# Patient Record
Sex: Female | Born: 1970 | Hispanic: No | Marital: Married | Smoking: Never smoker
Health system: Southern US, Community
[De-identification: ages and names within clinical notes are randomized; demographics above are authoritative.]

## PROBLEM LIST (undated history)

## (undated) DIAGNOSIS — R202 Paresthesia of skin: Secondary | ICD-10-CM

## (undated) DIAGNOSIS — T7840XA Allergy, unspecified, initial encounter: Secondary | ICD-10-CM

## (undated) DIAGNOSIS — M4802 Spinal stenosis, cervical region: Secondary | ICD-10-CM

## (undated) DIAGNOSIS — M5090 Cervical disc disorder, unspecified, unspecified cervical region: Secondary | ICD-10-CM

## (undated) DIAGNOSIS — M519 Unspecified thoracic, thoracolumbar and lumbosacral intervertebral disc disorder: Secondary | ICD-10-CM

## (undated) DIAGNOSIS — D649 Anemia, unspecified: Secondary | ICD-10-CM

## (undated) DIAGNOSIS — E042 Nontoxic multinodular goiter: Secondary | ICD-10-CM

## (undated) DIAGNOSIS — E785 Hyperlipidemia, unspecified: Secondary | ICD-10-CM

## (undated) DIAGNOSIS — N390 Urinary tract infection, site not specified: Secondary | ICD-10-CM

## (undated) HISTORY — DX: Hyperlipidemia, unspecified: E78.5

## (undated) HISTORY — DX: Paresthesia of skin: R20.2

## (undated) HISTORY — DX: Allergy, unspecified, initial encounter: T78.40XA

## (undated) HISTORY — DX: Cervical disc disorder, unspecified, unspecified cervical region: M50.90

## (undated) HISTORY — PX: OTHER SURGICAL HISTORY: SHX169

## (undated) HISTORY — PX: COLPOSCOPY: SHX161

## (undated) HISTORY — DX: Urinary tract infection, site not specified: N39.0

## (undated) HISTORY — PX: WISDOM TOOTH EXTRACTION: SHX21

## (undated) HISTORY — DX: Unspecified thoracic, thoracolumbar and lumbosacral intervertebral disc disorder: M51.9

## (undated) HISTORY — DX: Spinal stenosis, cervical region: M48.02

## (undated) HISTORY — DX: Anemia, unspecified: D64.9

---

## 1998-02-28 ENCOUNTER — Emergency Department (HOSPITAL_COMMUNITY): Admission: EM | Admit: 1998-02-28 | Discharge: 1998-02-28 | Payer: Self-pay | Admitting: Emergency Medicine

## 1998-03-26 ENCOUNTER — Emergency Department (HOSPITAL_COMMUNITY): Admission: EM | Admit: 1998-03-26 | Discharge: 1998-03-26 | Payer: Self-pay | Admitting: Emergency Medicine

## 1999-04-12 ENCOUNTER — Encounter: Admission: RE | Admit: 1999-04-12 | Discharge: 1999-04-12 | Payer: Self-pay | Admitting: Internal Medicine

## 1999-04-16 ENCOUNTER — Encounter: Admission: RE | Admit: 1999-04-16 | Discharge: 1999-04-16 | Payer: Self-pay | Admitting: Internal Medicine

## 1999-11-30 ENCOUNTER — Other Ambulatory Visit: Admission: RE | Admit: 1999-11-30 | Discharge: 1999-11-30 | Payer: Self-pay | Admitting: Obstetrics & Gynecology

## 2001-01-14 ENCOUNTER — Other Ambulatory Visit: Admission: RE | Admit: 2001-01-14 | Discharge: 2001-01-14 | Payer: Self-pay | Admitting: Obstetrics and Gynecology

## 2002-02-11 ENCOUNTER — Other Ambulatory Visit: Admission: RE | Admit: 2002-02-11 | Discharge: 2002-02-11 | Payer: Self-pay | Admitting: Obstetrics and Gynecology

## 2003-02-23 ENCOUNTER — Other Ambulatory Visit: Admission: RE | Admit: 2003-02-23 | Discharge: 2003-02-23 | Payer: Self-pay | Admitting: Obstetrics and Gynecology

## 2003-11-06 ENCOUNTER — Emergency Department (HOSPITAL_COMMUNITY): Admission: EM | Admit: 2003-11-06 | Discharge: 2003-11-06 | Payer: Self-pay | Admitting: *Deleted

## 2004-03-07 ENCOUNTER — Other Ambulatory Visit: Admission: RE | Admit: 2004-03-07 | Discharge: 2004-03-07 | Payer: Self-pay | Admitting: *Deleted

## 2004-09-02 ENCOUNTER — Emergency Department (HOSPITAL_COMMUNITY): Admission: EM | Admit: 2004-09-02 | Discharge: 2004-09-02 | Payer: Self-pay | Admitting: Emergency Medicine

## 2005-03-11 ENCOUNTER — Other Ambulatory Visit: Admission: RE | Admit: 2005-03-11 | Discharge: 2005-03-11 | Payer: Self-pay | Admitting: Obstetrics and Gynecology

## 2005-06-05 ENCOUNTER — Emergency Department (HOSPITAL_COMMUNITY): Admission: EM | Admit: 2005-06-05 | Discharge: 2005-06-05 | Payer: Self-pay | Admitting: Family Medicine

## 2005-06-13 ENCOUNTER — Emergency Department (HOSPITAL_COMMUNITY): Admission: EM | Admit: 2005-06-13 | Discharge: 2005-06-13 | Payer: Self-pay | Admitting: Family Medicine

## 2005-09-26 ENCOUNTER — Encounter: Admission: RE | Admit: 2005-09-26 | Discharge: 2005-09-26 | Payer: Self-pay | Admitting: Gastroenterology

## 2006-03-14 ENCOUNTER — Other Ambulatory Visit: Admission: RE | Admit: 2006-03-14 | Discharge: 2006-03-14 | Payer: Self-pay | Admitting: Obstetrics and Gynecology

## 2007-11-11 ENCOUNTER — Encounter: Admission: RE | Admit: 2007-11-11 | Discharge: 2007-12-16 | Payer: Self-pay | Admitting: Family Medicine

## 2007-12-29 ENCOUNTER — Encounter: Admission: RE | Admit: 2007-12-29 | Discharge: 2008-03-28 | Payer: Self-pay | Admitting: Family Medicine

## 2010-11-02 ENCOUNTER — Inpatient Hospital Stay (HOSPITAL_COMMUNITY)
Admission: AD | Admit: 2010-11-02 | Discharge: 2010-11-04 | DRG: 373 | Disposition: A | Discharge status: Home / Self Care | Payer: BC Managed Care – PPO | Source: Ambulatory Visit | Attending: Obstetrics and Gynecology | Admitting: Obstetrics and Gynecology

## 2010-11-02 DIAGNOSIS — O3660X0 Maternal care for excessive fetal growth, unspecified trimester, not applicable or unspecified: Secondary | ICD-10-CM | POA: Diagnosis present

## 2010-11-02 DIAGNOSIS — O09529 Supervision of elderly multigravida, unspecified trimester: Secondary | ICD-10-CM | POA: Diagnosis present

## 2010-11-02 LAB — CBC
HCT: 38.3 % (ref 36.0–46.0)
Hemoglobin: 12.7 g/dL (ref 12.0–15.0)
MCH: 29.7 pg (ref 26.0–34.0)
MCHC: 33.2 g/dL (ref 30.0–36.0)
MCV: 89.5 fL (ref 78.0–100.0)
Platelets: 153 10*3/uL (ref 150–400)
RBC: 4.28 MIL/uL (ref 3.87–5.11)
RDW: 13.4 % (ref 11.5–15.5)
WBC: 8.5 10*3/uL (ref 4.0–10.5)

## 2010-11-02 LAB — RPR: RPR Ser Ql: NONREACTIVE

## 2010-11-03 LAB — CBC
Hemoglobin: 11 g/dL — ABNORMAL LOW (ref 12.0–15.0)
MCH: 29.1 pg (ref 26.0–34.0)
RBC: 3.78 MIL/uL — ABNORMAL LOW (ref 3.87–5.11)
WBC: 13.2 10*3/uL — ABNORMAL HIGH (ref 4.0–10.5)

## 2011-04-12 ENCOUNTER — Encounter: Payer: Self-pay | Admitting: Internal Medicine

## 2011-04-12 ENCOUNTER — Other Ambulatory Visit (INDEPENDENT_AMBULATORY_CARE_PROVIDER_SITE_OTHER): Payer: BC Managed Care – PPO

## 2011-04-12 ENCOUNTER — Ambulatory Visit (INDEPENDENT_AMBULATORY_CARE_PROVIDER_SITE_OTHER): Payer: BC Managed Care – PPO | Admitting: Internal Medicine

## 2011-04-12 ENCOUNTER — Other Ambulatory Visit: Payer: Self-pay | Admitting: Internal Medicine

## 2011-04-12 VITALS — BP 100/70 | HR 96 | Temp 99.1°F | Ht 63.0 in | Wt 186.2 lb

## 2011-04-12 DIAGNOSIS — M4802 Spinal stenosis, cervical region: Secondary | ICD-10-CM | POA: Insufficient documentation

## 2011-04-12 DIAGNOSIS — M5090 Cervical disc disorder, unspecified, unspecified cervical region: Secondary | ICD-10-CM | POA: Insufficient documentation

## 2011-04-12 DIAGNOSIS — D649 Anemia, unspecified: Secondary | ICD-10-CM | POA: Insufficient documentation

## 2011-04-12 DIAGNOSIS — Z23 Encounter for immunization: Secondary | ICD-10-CM

## 2011-04-12 DIAGNOSIS — E785 Hyperlipidemia, unspecified: Secondary | ICD-10-CM | POA: Insufficient documentation

## 2011-04-12 DIAGNOSIS — T7840XA Allergy, unspecified, initial encounter: Secondary | ICD-10-CM | POA: Insufficient documentation

## 2011-04-12 DIAGNOSIS — Z0000 Encounter for general adult medical examination without abnormal findings: Secondary | ICD-10-CM

## 2011-04-12 DIAGNOSIS — K5190 Ulcerative colitis, unspecified, without complications: Secondary | ICD-10-CM | POA: Insufficient documentation

## 2011-04-12 DIAGNOSIS — R202 Paresthesia of skin: Secondary | ICD-10-CM | POA: Insufficient documentation

## 2011-04-12 DIAGNOSIS — M519 Unspecified thoracic, thoracolumbar and lumbosacral intervertebral disc disorder: Secondary | ICD-10-CM | POA: Insufficient documentation

## 2011-04-12 LAB — CBC WITH DIFFERENTIAL/PLATELET
Eosinophils Absolute: 0.1 10*3/uL (ref 0.0–0.7)
Hemoglobin: 13.4 g/dL (ref 12.0–15.0)
Lymphs Abs: 2.2 10*3/uL (ref 0.7–4.0)
Monocytes Absolute: 0.4 10*3/uL (ref 0.1–1.0)
RBC: 4.7 Mil/uL (ref 3.87–5.11)

## 2011-04-12 LAB — HEPATIC FUNCTION PANEL
Albumin: 3.9 g/dL (ref 3.5–5.2)
Alkaline Phosphatase: 95 U/L (ref 39–117)
Bilirubin, Direct: 0.2 mg/dL (ref 0.0–0.3)
Total Bilirubin: 1 mg/dL (ref 0.3–1.2)
Total Protein: 7.4 g/dL (ref 6.0–8.3)

## 2011-04-12 LAB — TSH: TSH: 0.41 u[IU]/mL (ref 0.35–5.50)

## 2011-04-12 LAB — LIPID PANEL
HDL: 47.5 mg/dL (ref >39.00)
Total CHOL/HDL Ratio: 4
Triglycerides: 61 mg/dL (ref 0.0–149.0)
VLDL: 12.2 mg/dL (ref 0.0–40.0)

## 2011-04-12 LAB — BASIC METABOLIC PANEL
Calcium: 8.6 mg/dL (ref 8.4–10.5)
Chloride: 104 mEq/L (ref 96–112)
Creatinine, Ser: 0.7 mg/dL (ref 0.4–1.2)

## 2011-04-12 LAB — LDL CHOLESTEROL, DIRECT: Direct LDL: 149.6 mg/dL

## 2011-04-12 MED ORDER — TETANUS-DIPHTH-ACELL PERTUSSIS 5-2.5-18.5 LF-MCG/0.5 IM SUSP
0.5000 mL | Freq: Once | INTRAMUSCULAR | Status: AC
  Administered 2011-04-12: 0.5 mL via INTRAMUSCULAR

## 2011-04-12 NOTE — Progress Notes (Signed)
Subjective:    Patient ID: Colleen Mcgee, female    DOB: 1970/09/18, 40 y.o.   MRN: [id number]  HPI Here for wellness and f/u;  Overall doing ok;  Pt denies CP, worsening SOB, DOE, wheezing, orthopnea, PND, worsening LE edema, palpitations, dizziness or syncope.  Pt denies neurological change such as new Headache, facial or extremity weakness.  Pt denies polydipsia, polyuria, or low sugar symptoms. Pt states overall good compliance with treatment and medications, good tolerability, and trying to follow lower cholesterol diet.  Pt denies worsening depressive symptoms, suicidal ideation or panic. No fever, wt loss, night sweats, loss of appetite, or other constitutional symptoms.  Pt states good ability with ADL's, low fall risk, home safety reviewed and adequate, no significant changes in hearing or vision, and occasionally active with exercise, but plans to do more in an effort to lose the baby wt (postpartum approx 5 mo)  Has recurring lower back aching which limits her now, but no radicaular pain and thinks it may get better with wt loss as she has done in the past Past Medical History  Diagnosis Date  . UTI (lower urinary tract infection)   . Cervical stenosis of spine   . Allergy   . Asthma   . Hyperlipidemia   . Anemia   . Ulcerative colitis   . Cervical disc disease   . Lumbar disc disease   . Distal paresthesia    Past Surgical History  Procedure Date  . Colposcopy     abnormal paps- 1992; 2010 and 2012    reports that she has never smoked. She does not have any smokeless tobacco history on file. She reports that she drinks alcohol. She reports that she does not use illicit drugs. family history includes Alcohol abuse in her father; Cancer in her mother, other, and paternal grandmother; Diabetes in her maternal grandmother; Hyperlipidemia in her maternal grandmother and mother; Hypertension in her father, maternal grandfather, maternal grandmother, and mother; Seizures in her father; and  Stroke in her maternal grandmother and paternal grandfather. No Known Allergies No current outpatient prescriptions on file prior to visit.   No current facility-administered medications on file prior to visit.   Review of Systems Review of Systems  Constitutional: Negative for diaphoresis, activity change, appetite change and unexpected weight change.  HENT: Negative for hearing loss, ear pain, facial swelling, mouth sores and neck stiffness.   Eyes: Negative for pain, redness and visual disturbance.  Respiratory: Negative for shortness of breath and wheezing.   Cardiovascular: Negative for chest pain and palpitations.  Gastrointestinal: Negative for diarrhea, blood in stool, abdominal distention and rectal pain.  Genitourinary: Negative for hematuria, flank pain and decreased urine volume.  Musculoskeletal: Negative for myalgias and joint swelling.  Skin: Negative for color change and wound.  Neurological: Negative for syncope and numbness.  Hematological: Negative for adenopathy.  Psychiatric/Behavioral: Negative for hallucinations, self-injury, decreased concentration and agitation.      Objective:   Physical Exam BP 100/70  Pulse 96  Temp(Src) 99.1 F (37.3 C) (Oral)  Ht 5\' 3"  (1.6 m)  Wt 186 lb 4 oz (84.482 kg)  BMI 32.99 kg/m2  SpO2 98%  LMP 04/10/2011 Physical Exam  VS noted, obese Constitutional: Pt is oriented to person, place, and time. Appears well-developed. Head: Normocephalic and atraumatic.  Right Ear: External ear normal.  Left Ear: External ear normal.  Nose: Nose normal.  Mouth/Throat: Oropharynx is clear and moist.  Eyes: Conjunctivae and EOM are normal. Pupils are equal,  round, and reactive to light.  Neck: Normal range of motion. Neck supple. No JVD present. No tracheal deviation present.  Cardiovascular: Normal rate, regular rhythm, normal heart sounds and intact distal pulses.   Pulmonary/Chest: Effort normal and breath sounds normal.  Abdominal:  Soft. Bowel sounds are normal. There is no tenderness.  Musculoskeletal: Normal range of motion. Exhibits no edema.  Lymphadenopathy:  Has no cervical adenopathy.  Neurological: Pt is alert and oriented to person, place, and time. Pt has normal reflexes. No cranial nerve deficit.  Skin: Skin is warm and dry. No rash noted.  Psychiatric:  Has  normal mood and affect. Behavior is normal.  No spine tender today       Assessment & Plan:   No problem-specific assessment & plan notes found for this encounter.

## 2011-04-12 NOTE — Patient Instructions (Addendum)
You had the tetanus shot today Please keep your appointments with your specialists as you have planned - GYN for pap and mammogram next month Please go to LAB in the Basement for the blood and/or urine tests to be done today Please call the phone number [phone] (the PhoneTree System) for results of testing in 2-3 days;  When calling, simply dial the number, and when prompted enter the MRN number above (the Medical Record Number) and the # key, then the message should start. Please return in 1 year for your yearly visit, or sooner if needed, with Lab testing done 3-5 days before

## 2011-04-12 NOTE — Assessment & Plan Note (Signed)
Overall doing well, age appropriate education and counseling updated, referrals for preventative services and immunizations addressed, dietary and smoking counseling addressed, most recent labs and ECG reviewed.  I have personally reviewed and have noted: 1) the patient's medical and social history 2) The pt's use of alcohol, tobacco, and illicit drugs 3) The patient's current medications and supplements 4) Functional ability including ADL's, fall risk, home safety risk, hearing and visual impairment 5) Diet and physical activities 6) Evidence for depression or mood disorder 7) The patient's height, weight, and BMI have been recorded in the chart I have made referrals, and provided counseling and education based on review of the above Due for tetanus today

## 2011-06-21 ENCOUNTER — Telehealth: Payer: Self-pay | Admitting: *Deleted

## 2011-06-21 MED ORDER — PHENTERMINE HCL 37.5 MG PO CAPS: 37.5000 mg | ORAL_CAPSULE | ORAL | Status: AC

## 2011-06-21 NOTE — Telephone Encounter (Signed)
Left msg on vm had discuss phentermine with md at last ov. Pt is wanting to go ahead and try phentermine. Is this ok?

## 2011-06-21 NOTE — Telephone Encounter (Signed)
Done to robin desk 

## 2011-06-24 NOTE — Telephone Encounter (Signed)
Called the patient left message that Phentermine was filled and faxed hardcopy to [facility] at [phone]

## 2011-07-26 ENCOUNTER — Telehealth: Payer: Self-pay

## 2011-07-26 NOTE — Telephone Encounter (Signed)
Called [facility] to Prior Authorize Phentermine, case number [id number]. Medication was approved from Oct. 10, 2012 through October 24, 2011. Called patient left message informed of approval.

## 2011-11-12 ENCOUNTER — Other Ambulatory Visit: Payer: Self-pay | Admitting: Obstetrics and Gynecology

## 2011-11-12 ENCOUNTER — Ambulatory Visit (INDEPENDENT_AMBULATORY_CARE_PROVIDER_SITE_OTHER): Payer: BC Managed Care – PPO | Admitting: Registered Nurse

## 2011-11-12 DIAGNOSIS — Z01419 Encounter for gynecological examination (general) (routine) without abnormal findings: Secondary | ICD-10-CM

## 2011-11-12 DIAGNOSIS — Z1231 Encounter for screening mammogram for malignant neoplasm of breast: Secondary | ICD-10-CM

## 2011-11-12 DIAGNOSIS — Z202 Contact with and (suspected) exposure to infections with a predominantly sexual mode of transmission: Secondary | ICD-10-CM

## 2011-12-02 ENCOUNTER — Ambulatory Visit: Payer: BC Managed Care – PPO

## 2011-12-18 ENCOUNTER — Ambulatory Visit: Payer: BC Managed Care – PPO

## 2012-06-30 ENCOUNTER — Telehealth: Payer: Self-pay

## 2012-06-30 ENCOUNTER — Ambulatory Visit (INDEPENDENT_AMBULATORY_CARE_PROVIDER_SITE_OTHER): Payer: BC Managed Care – PPO | Admitting: Emergency Medicine

## 2012-06-30 VITALS — BP 130/82 | HR 81 | Temp 99.3°F | Resp 19 | Ht 63.75 in | Wt 188.2 lb

## 2012-06-30 DIAGNOSIS — J018 Other acute sinusitis: Secondary | ICD-10-CM

## 2012-06-30 MED ORDER — AMOXICILLIN-POT CLAVULANATE 875-125 MG PO TABS: 1.0000 | ORAL_TABLET | Freq: Two times a day (BID) | ORAL | Status: DC

## 2012-06-30 MED ORDER — PSEUDOEPHEDRINE-GUAIFENESIN ER 60-600 MG PO TB12: 1.0000 | ORAL_TABLET | Freq: Two times a day (BID) | ORAL | Status: AC

## 2012-06-30 NOTE — Telephone Encounter (Signed)
PATIENT STATES SHE SAW DR. Dareen Piano Tuesday FOR A SINUS INFECTION. SHE WOULD LIKE TO GET A NOTE FOR WORK. 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 SHE WOULD LIKE THE NOTE TO HAVE HER OUT ON MON 10/14, TUES. 10/15 AND WED. 07/01/2012. SHE WOULD LIKE TO RETURN ON THURS. 07/02/2012. PLEASE CALL HER WHEN THE NOTE CAN BE PICKED UP. BEST PHONE [phone]. (CELL)   MBC

## 2012-06-30 NOTE — Telephone Encounter (Signed)
Note up front, LMOM to pick up note.

## 2012-06-30 NOTE — Progress Notes (Signed)
[facility] [address] [place] [address] [phone]  Date:  06/30/2012   Name:  Colleen Mcgee   DOB:  [birth year]   MRN:  [id number]  PCP:  Oliver Barre, MD    Chief Complaint: Sore Throat, Generalized Body Aches and Sinusitis   History of Present Illness:  Colleen Mcgee is a 41 y.o. very pleasant female patient who presents with the following:  Marked nasal congestion and post nasal drainage for past 5 days associated with sore throat and headache. Now has myalgias and malaise.  Had chills yesterday and a low grade temperature today.  Cough is not productive and worse at night.  Not improved with OTC medication.  History of prior sinusitis.  Patient Active Problem List  Diagnosis  . Allergy  . Asthma  . Hyperlipidemia  . Anemia  . Cervical stenosis of spine  . Ulcerative colitis  . Cervical disc disease  . Lumbar disc disease  . Distal paresthesia  . Preventative health care    Past Medical History  Diagnosis Date  . UTI (lower urinary tract infection)   . Cervical stenosis of spine   . Allergy   . Asthma   . Hyperlipidemia   . Anemia   . Ulcerative colitis   . Cervical disc disease   . Lumbar disc disease   . Distal paresthesia     Past Surgical History  Procedure Date  . Colposcopy     abnormal paps- 1992; 2010 and 2012    History  Substance Use Topics  . Smoking status: Never Smoker   . Smokeless tobacco: Never Used  . Alcohol Use: No     social    Family History  Problem Relation Age of Onset  . Cancer Mother     colon cancer  . Hyperlipidemia Mother   . Hypertension Mother   . Alcohol abuse Father   . Hypertension Father   . Seizures Father   . Hyperlipidemia Maternal Grandmother   . Stroke Maternal Grandmother   . Hypertension Maternal Grandmother   . Diabetes Maternal Grandmother   . Hypertension Maternal Grandfather   . Cancer Paternal Grandmother     breast cancer  . Stroke Paternal Grandfather   . Cancer  Other     prostate cancer    No Known Allergies  Medication list has been reviewed and updated.  Current Outpatient Prescriptions on File Prior to Visit  Medication Sig Dispense Refill  . levonorgestrel-ethinyl estradiol (NORDETTE) 0.15-30 MG-MCG tablet Take 1 tablet by mouth daily.          Review of Systems:  As per HPI, otherwise negative.    Physical Examination: Filed Vitals:   06/30/12 1426  BP: 130/82  Pulse: 81  Temp: 99.3 F (37.4 C)  Resp: 19   Filed Vitals:   06/30/12 1426  Height: 5' 3.75" (1.619 m)  Weight: 188 lb 3.2 oz (85.367 kg)   Body mass index is 32.56 kg/(m^2). Ideal Body Weight: Weight in (lb) to have BMI = 25: 144.2   GEN: WDWN, NAD, Non-toxic, A & O x 3.  No rash or sepsis HEENT: Atraumatic, Normocephalic. Neck supple. No masses, No LAD.  Oropharynx negative Ears and Nose: No external deformity.  TM negative CV: RRR, No M/G/R. No JVD. No thrill. No extra heart sounds. PULM: CTA B, no wheezes, crackles, rhonchi. No retractions. No resp. distress. No accessory muscle use. ABD: S, NT, ND, +BS. No rebound. No HSM. EXTR: No c/c/e  NEURO Normal gait.  PSYCH: Normally interactive. Conversant. Not depressed or anxious appearing.  Calm demeanor.    Assessment and Plan: Sinusitis augmentin mucinex   Carmelina Dane, MD  I have reviewed and agree with documentation. Robert P. Merla Riches, M.D.

## 2013-05-21 ENCOUNTER — Other Ambulatory Visit: Payer: Self-pay | Admitting: Obstetrics and Gynecology

## 2013-05-21 DIAGNOSIS — Z1231 Encounter for screening mammogram for malignant neoplasm of breast: Secondary | ICD-10-CM

## 2013-06-09 ENCOUNTER — Ambulatory Visit
Admission: RE | Admit: 2013-06-09 | Discharge: 2013-06-09 | Disposition: A | Discharge status: Home / Self Care | Payer: BC Managed Care – PPO | Source: Ambulatory Visit | Attending: Obstetrics and Gynecology | Admitting: Obstetrics and Gynecology

## 2013-06-09 DIAGNOSIS — Z1231 Encounter for screening mammogram for malignant neoplasm of breast: Secondary | ICD-10-CM

## 2014-05-03 ENCOUNTER — Other Ambulatory Visit: Payer: Self-pay

## 2014-05-03 DIAGNOSIS — Z1231 Encounter for screening mammogram for malignant neoplasm of breast: Secondary | ICD-10-CM

## 2014-06-13 ENCOUNTER — Ambulatory Visit
Admission: RE | Admit: 2014-06-13 | Discharge: 2014-06-13 | Disposition: A | Discharge status: Home / Self Care | Payer: BC Managed Care – PPO | Source: Ambulatory Visit

## 2014-06-13 DIAGNOSIS — Z1231 Encounter for screening mammogram for malignant neoplasm of breast: Secondary | ICD-10-CM

## 2015-04-17 ENCOUNTER — Other Ambulatory Visit: Payer: Self-pay | Admitting: Obstetrics and Gynecology

## 2015-04-17 DIAGNOSIS — E010 Iodine-deficiency related diffuse (endemic) goiter: Secondary | ICD-10-CM

## 2015-04-19 ENCOUNTER — Ambulatory Visit
Admission: RE | Admit: 2015-04-19 | Discharge: 2015-04-19 | Disposition: A | Discharge status: Home / Self Care | Payer: BC Managed Care – PPO | Source: Ambulatory Visit | Attending: Obstetrics and Gynecology | Admitting: Obstetrics and Gynecology

## 2015-04-19 DIAGNOSIS — E010 Iodine-deficiency related diffuse (endemic) goiter: Secondary | ICD-10-CM

## 2016-04-18 ENCOUNTER — Other Ambulatory Visit: Payer: Self-pay | Admitting: Obstetrics and Gynecology

## 2016-04-18 DIAGNOSIS — E042 Nontoxic multinodular goiter: Secondary | ICD-10-CM

## 2016-04-23 ENCOUNTER — Other Ambulatory Visit: Payer: BC Managed Care – PPO

## 2016-08-31 ENCOUNTER — Ambulatory Visit (HOSPITAL_COMMUNITY)
Admission: EM | Admit: 2016-08-31 | Discharge: 2016-08-31 | Disposition: A | Discharge status: Home / Self Care | Payer: BC Managed Care – PPO | Attending: Family Medicine | Admitting: Family Medicine

## 2016-08-31 ENCOUNTER — Encounter (HOSPITAL_COMMUNITY): Payer: Self-pay | Admitting: *Deleted

## 2016-08-31 DIAGNOSIS — J209 Acute bronchitis, unspecified: Secondary | ICD-10-CM

## 2016-08-31 DIAGNOSIS — R062 Wheezing: Secondary | ICD-10-CM

## 2016-08-31 DIAGNOSIS — R05 Cough: Secondary | ICD-10-CM

## 2016-08-31 DIAGNOSIS — R059 Cough, unspecified: Secondary | ICD-10-CM

## 2016-08-31 HISTORY — DX: Nontoxic multinodular goiter: E04.2

## 2016-08-31 MED ORDER — AZITHROMYCIN 250 MG PO TABS: 250.0000 mg | ORAL_TABLET | Freq: Every day | ORAL | 0 refills | Status: DC

## 2016-08-31 MED ORDER — METHYLPREDNISOLONE 4 MG PO TBPK: ORAL_TABLET | ORAL | 0 refills | Status: DC

## 2016-08-31 MED ORDER — BENZONATATE 100 MG PO CAPS: 200.0000 mg | ORAL_CAPSULE | Freq: Three times a day (TID) | ORAL | 0 refills | Status: DC | PRN

## 2016-08-31 MED ORDER — ALBUTEROL SULFATE HFA 108 (90 BASE) MCG/ACT IN AERS: 2.0000 | INHALATION_SPRAY | RESPIRATORY_TRACT | 0 refills | Status: DC | PRN

## 2016-08-31 NOTE — ED Provider Notes (Signed)
CSN: [id number]     Arrival date & time 08/31/16  1453 History   First MD Initiated Contact with Patient 08/31/16 1549     Chief Complaint  Patient presents with  . Nasal Congestion  . Cough   (Consider location/radiation/quality/duration/timing/severity/associated sxs/prior Treatment) C/o uri sx's and cough for 2 weeks.     The history is provided by the patient.  Cough  Cough characteristics:  Productive Sputum characteristics:  Yellow Severity:  Moderate Onset quality:  Sudden Duration:  2 weeks Timing:  Constant Progression:  Waxing and waning Chronicity:  New Smoker: no   Context: upper respiratory infection and weather changes   Relieved by:  Nothing Worsened by:  Nothing Ineffective treatments:  None tried Associated symptoms: rhinorrhea and sore throat     Past Medical History:  Diagnosis Date  . Allergy   . Anemia   . Asthma   . Cervical disc disease   . Cervical stenosis of spine   . Distal paresthesia   . Hyperlipidemia   . Lumbar disc disease   . Multiple thyroid nodules   . Ulcerative colitis   . UTI (lower urinary tract infection)    Past Surgical History:  Procedure Laterality Date  . COLPOSCOPY     abnormal paps- 1992; 2010 and 2012  . WISDOM TOOTH EXTRACTION     Family History  Problem Relation Age of Onset  . Cancer Mother     colon cancer  . Hyperlipidemia Mother   . Hypertension Mother   . Alcohol abuse Father   . Hypertension Father   . Seizures Father   . Hyperlipidemia Maternal Grandmother   . Stroke Maternal Grandmother   . Hypertension Maternal Grandmother   . Diabetes Maternal Grandmother   . Hypertension Maternal Grandfather   . Cancer Paternal Grandmother     breast cancer  . Stroke Paternal Grandfather   . Cancer Other     prostate cancer   Social History  Substance Use Topics  . Smoking status: Never Smoker  . Smokeless tobacco: Never Used  . Alcohol use No     Comment: social   OB History    No data  available     Review of Systems  Constitutional: Positive for fatigue.  HENT: Positive for rhinorrhea and sore throat.   Eyes: Negative.   Respiratory: Positive for cough.   Cardiovascular: Negative.   Gastrointestinal: Negative.   Endocrine: Negative.   Genitourinary: Negative.   Musculoskeletal: Negative.   Skin: Negative.   Allergic/Immunologic: Negative.   Neurological: Negative.   Hematological: Negative.   Psychiatric/Behavioral: Negative.     Allergies  Aspirin  Home Medications   Prior to Admission medications   Medication Sig Start Date End Date Taking? Authorizing Provider  UNKNOWN TO PATIENT BCPs   Yes Historical Provider, MD  albuterol (PROVENTIL HFA;VENTOLIN HFA) 108 (90 Base) MCG/ACT inhaler Inhale 2 puffs into the lungs every 4 (four) hours as needed for wheezing or shortness of breath. 08/31/16   Deatra CanterWilliam J , FNP  amoxicillin-clavulanate (AUGMENTIN) 875-125 MG per tablet Take 1 tablet by mouth 2 (two) times daily. 06/30/12   Carmelina DaneJeffery S Anderson, MD  azithromycin (ZITHROMAX) 250 MG tablet Take 1 tablet (250 mg total) by mouth daily. Take first 2 tablets together, then 1 every day until finished. 08/31/16   Deatra CanterWilliam J , FNP  benzonatate (TESSALON) 100 MG capsule Take 2 capsules (200 mg total) by mouth 3 (three) times daily as needed for cough. 08/31/16   Anselm PancoastWilliam J  , FNP  levonorgestrel-ethinyl estradiol (NORDETTE) 0.15-30 MG-MCG tablet Take 1 tablet by mouth daily.      Historical Provider, MD  methylPREDNISolone (MEDROL DOSEPAK) 4 MG TBPK tablet Take 6-5-4-3-2-1 po qd 08/31/16   Deatra CanterWilliam J , FNP   Meds Ordered and Administered this Visit  Medications - No data to display  BP 131/73   Pulse 99   Temp 98.5 F (36.9 C) (Oral)   Resp 18   LMP 08/19/2016 (Exact Date)   SpO2 100%  No data found.   Physical Exam  Constitutional: She appears well-developed and well-nourished.  HENT:  Head: Normocephalic and atraumatic.  Right Ear:  External ear normal.  Left Ear: External ear normal.  Mouth/Throat: Oropharynx is clear and moist.  Eyes: Conjunctivae and EOM are normal. Pupils are equal, round, and reactive to light.  Neck: Neck supple.  Cardiovascular: Normal rate, regular rhythm and normal heart sounds.   Pulmonary/Chest: Effort normal and breath sounds normal.  Abdominal: Soft. Bowel sounds are normal.  Nursing note and vitals reviewed.   Urgent Care Course   Clinical Course     Procedures (including critical care time)  Labs Review Labs Reviewed - No data to display  Imaging Review No results found.   Visual Acuity Review  Right Eye Distance:   Left Eye Distance:   Bilateral Distance:    Right Eye Near:   Left Eye Near:    Bilateral Near:         MDM   1. Acute bronchitis, unspecified organism   2. Cough   3. Wheezing    Medrol dose pack Zpak Albuterol mdi Tessalon perles Push po fluids, rest, tylenol and motrin otc prn as directed for fever, arthralgias, and myalgias.  Follow up prn if sx's continue or persist.    Deatra CanterWilliam J , FNP 08/31/16 [phone]

## 2016-08-31 NOTE — ED Triage Notes (Signed)
Reports starting with sinus/facial pain and congestion approx 2 wks ago.  Congestion now moving down into chest.  C/O slightly bloody nasal discharge, dry wheezy cough.  Has had slightly elevated temp 3 days ago.  Has tried IBU, [place].

## 2019-11-17 ENCOUNTER — Other Ambulatory Visit (HOSPITAL_COMMUNITY): Payer: Self-pay | Admitting: Surgery

## 2019-11-17 ENCOUNTER — Other Ambulatory Visit: Payer: Self-pay | Admitting: Surgery

## 2019-11-25 ENCOUNTER — Other Ambulatory Visit: Payer: Self-pay

## 2019-11-25 ENCOUNTER — Ambulatory Visit (HOSPITAL_COMMUNITY)
Admission: RE | Admit: 2019-11-25 | Discharge: 2019-11-25 | Disposition: A | Discharge status: Home / Self Care | Payer: BC Managed Care – PPO | Source: Ambulatory Visit | Attending: Surgery | Admitting: Surgery

## 2019-11-30 ENCOUNTER — Other Ambulatory Visit: Payer: Self-pay | Admitting: Obstetrics and Gynecology

## 2019-11-30 DIAGNOSIS — N920 Excessive and frequent menstruation with regular cycle: Secondary | ICD-10-CM

## 2019-12-01 ENCOUNTER — Ambulatory Visit
Admission: RE | Admit: 2019-12-01 | Discharge: 2019-12-01 | Disposition: A | Discharge status: Home / Self Care | Payer: BC Managed Care – PPO | Source: Ambulatory Visit | Attending: Obstetrics and Gynecology | Admitting: Obstetrics and Gynecology

## 2019-12-01 DIAGNOSIS — N920 Excessive and frequent menstruation with regular cycle: Secondary | ICD-10-CM

## 2019-12-03 ENCOUNTER — Other Ambulatory Visit: Payer: Self-pay | Admitting: Obstetrics and Gynecology

## 2019-12-10 ENCOUNTER — Encounter: Payer: Self-pay | Admitting: Dietician

## 2019-12-10 ENCOUNTER — Other Ambulatory Visit: Payer: Self-pay

## 2019-12-10 ENCOUNTER — Encounter: Payer: BC Managed Care – PPO | Attending: Surgery | Admitting: Dietician

## 2019-12-10 DIAGNOSIS — E669 Obesity, unspecified: Secondary | ICD-10-CM | POA: Insufficient documentation

## 2019-12-10 NOTE — Progress Notes (Signed)
Nutrition Assessment for Bariatric Surgery Medical Nutrition Therapy   Patient was seen on 12/10/2019 for Pre-Operative Nutrition Assessment. Letter of approval faxed to [facility] bariatric surgery program coordinator on 12/10/2019.    Referral stated Supervised Weight Loss (SWL) visits needed: 0  Planned surgery: Sleeve Pt expectation of surgery: to feel better physically, be able to run more, feel more confident   NUTRITION ASSESSMENT   Anthropometrics  Start weight at [facility]: 230 lbs (date: 12/10/2019) Height: 63 in BMI: 40.7 kg/m2     Lifestyle & Dietary Hx Works a lot, works a second job in the [place]. Cares for family.   Does not eat breakfast usually, typical meal pattern is 2 meals plus snacks in the evening. Likes soups for lunch and will get vegetables in these. May make stir fry with chicken, chili, or a pasta dish. Eats out a few times per week, may have [place] or Mediterranean, [employer]. Avoids beef and pork. Snacks include chips, cheese puffs, oatmeal cream pie cookies.   24-Hr Dietary Recall First Meal: coffee + collagen powder Snack: - Second Meal: [employer] tomato basil soup + 1/2 sandwich  Snack: - Third Meal: tacos  Snack: chips Beverages: water, zero calorie sparkling water   NUTRITION DIAGNOSIS  Overweight/obesity ([place]-3.3) related to past poor dietary habits and physical inactivity as evidenced by patient w/ planned Sleeve Gastrectomy surgery following dietary guidelines for continued weight loss.    NUTRITION INTERVENTION  Nutrition counseling (C-1) and education (E-2) to facilitate bariatric surgery goals.  Pre-Op Goals Reviewed with the Patient . Track food and beverage intake (pen and paper, MyFitness Pal, Baritastic app, etc.) . Make healthy food choices while monitoring portion sizes . Consume 3 meals per day or try to eat every 3-5 hours . Avoid concentrated sugars and fried foods . Keep sugar & fat in the single digits per serving on food  labels . Practice CHEWING your food (aim for applesauce consistency) . Practice not drinking 15 minutes before, during, and 30 minutes after each meal and snack . Avoid all carbonated beverages (ex: soda, sparkling beverages)  . Limit caffeinated beverages (ex: coffee, tea, energy drinks) . Avoid all sugar-sweetened beverages (ex: regular soda, sports drinks)  . Avoid alcohol  . Aim for 64-100 ounces of FLUID daily (with at least half of fluid intake being plain water)  . Aim for at least 60-80 grams of PROTEIN daily . Look for a liquid protein source that contains ?15 g protein and ?5 g carbohydrate (ex: shakes, drinks, shots) . Make a list of non-food related activities . Physical activity is an important part of a healthy lifestyle so keep it moving! The goal is to reach 150 minutes of exercise per week, including cardiovascular and weight baring activity.  *Goals that are bolded indicate the pt would like to start working towards these  Handouts Provided Include  . Bariatric Surgery handouts (Nutrition Visits, Pre-Op Goals, Protein Shakes, Vitamins & Minerals)  Learning Style & Readiness for Change Teaching method utilized: Visual & Auditory  Demonstrated degree of understanding via: Teach Back  Barriers to learning/adherence to lifestyle change: None Identified   MONITORING & EVALUATION Dietary intake, weekly physical activity, body weight, and pre-op goals reached at next nutrition visit.   Next Steps Patient is to follow up at [facility] for Pre-Op Class (>2 weeks before surgery) for further nutrition education.

## 2020-12-25 ENCOUNTER — Telehealth: Payer: Self-pay | Admitting: Hematology and Oncology

## 2020-12-25 NOTE — Telephone Encounter (Signed)
Received a new hem referral for IDA. Colleen Mcgee returned my call and has been scheduled to see Dr. Bertis Ruddy on 4/15 at 2pm. Pt aware to arrive 30 minutes early.

## 2020-12-26 ENCOUNTER — Telehealth: Payer: Self-pay

## 2020-12-26 NOTE — Telephone Encounter (Signed)
Called regarding appt on Friday, ask her to arrive at 1 pm for 1:30 pm appt. She is agreeable and aware of appt times.

## 2020-12-29 ENCOUNTER — Other Ambulatory Visit: Payer: Self-pay

## 2020-12-29 ENCOUNTER — Encounter: Payer: Self-pay | Admitting: Hematology and Oncology

## 2020-12-29 ENCOUNTER — Inpatient Hospital Stay: Payer: BC Managed Care – PPO | Attending: Hematology and Oncology | Admitting: Hematology and Oncology

## 2020-12-29 ENCOUNTER — Telehealth: Payer: Self-pay | Admitting: Hematology and Oncology

## 2020-12-29 DIAGNOSIS — D649 Anemia, unspecified: Secondary | ICD-10-CM | POA: Insufficient documentation

## 2020-12-29 DIAGNOSIS — R233 Spontaneous ecchymoses: Secondary | ICD-10-CM | POA: Insufficient documentation

## 2020-12-29 DIAGNOSIS — Z6841 Body Mass Index (BMI) 40.0 and over, adult: Secondary | ICD-10-CM

## 2020-12-29 DIAGNOSIS — R238 Other skin changes: Secondary | ICD-10-CM

## 2020-12-29 NOTE — Telephone Encounter (Signed)
Scheduled per los. Declined printout  

## 2020-12-29 NOTE — Progress Notes (Signed)
[facility] CONSULT NOTE  Patient Care Team: Mila Palmer, MD as PCP - General (Family Medicine)  CHIEF COMPLAINTS/PURPOSE OF CONSULTATION:  Anemia, easy bruising  HISTORY OF PRESENTING ILLNESS:  Colleen Mcgee 50 y.o. female is here because of referral from [facility] bariatric surgery team for evaluation of this patient with history of anemia and easy bruising The patient have class III obesity and is currently being evaluated for possible bariatric surgery in the future I do not have a recent copy of her blood work Anemia is in her history but according to latest set of blood work that I could see dated 05/04/2020 from [place], she is not anemic and not iron deficient The patient stated she have history of iron deficiency secondary to ulcerative colitis but for the past few years, her anemia has resolved She denies significant flare of ulcerative colitis She has not needed steroid treatment for a long time She also have history of sinus congestion and asthma but has not been on steroid containing products for a long time  She believes the main purpose of seeing me is to rule out bleeding disorder The patient stated that she bruises easily A few times over the past few months, she noted spontaneous bruising and did not recall hitting herself or injuring herself She denies spontaneous bleeding episodes She has not needed blood transfusion in the past She takes occasional NSAID but denies taking any aspirin containing products She had wisdom tooth removal, cataract surgery and colposcopy in the past without excessive bleeding She has natural childbirth and denies postpartum hemorrhage She is currently on birth control pill to control her menstrual cycles She desires bariatric surgery this summer and hence is referred here for surgical clearance from hematology standpoint  MEDICAL HISTORY:  Past Medical History:  Diagnosis Date  . Allergy   . Anemia   . Asthma   . Cervical disc  disease   . Cervical stenosis of spine   . Distal paresthesia   . Hyperlipidemia   . Lumbar disc disease   . Multiple thyroid nodules   . Ulcerative colitis   . UTI (lower urinary tract infection)     SURGICAL HISTORY: Past Surgical History:  Procedure Laterality Date  . cataract Left   . COLPOSCOPY     abnormal paps- 1992; 2010 and 2012  . WISDOM TOOTH EXTRACTION      SOCIAL HISTORY: Social History   Socioeconomic History  . Marital status: Single    Spouse name: Not on file  . Number of children: 2  . Years of education: 42  . Highest education level: Not on file  Occupational History  . Occupation: ED. S teacher  Tobacco Use  . Smoking status: Never Smoker  . Smokeless tobacco: Never Used  Substance and Sexual Activity  . Alcohol use: No    Comment: social  . Drug use: No  . Sexual activity: Not on file  Other Topics Concern  . Not on file  Social History Narrative  . Not on file   Social Determinants of Health   Financial Resource Strain: Not on file  Food Insecurity: Not on file  Transportation Needs: Not on file  Physical Activity: Not on file  Stress: Not on file  Social Connections: Not on file  Intimate Partner Violence: Not on file    FAMILY HISTORY: Family History  Problem Relation Age of Onset  . Cancer Mother        colon cancer  . Hyperlipidemia Mother   .  Hypertension Mother   . Alcohol abuse Father   . Hypertension Father   . Seizures Father   . Hyperlipidemia Maternal Grandmother   . Stroke Maternal Grandmother   . Hypertension Maternal Grandmother   . Diabetes Maternal Grandmother   . Hypertension Maternal Grandfather   . Cancer Paternal Grandmother        breast cancer  . Stroke Paternal Grandfather   . Cancer Other        prostate cancer    ALLERGIES:  is allergic to aspirin.  MEDICATIONS:  Current Outpatient Medications  Medication Sig Dispense Refill  . CHLOROPHYLL PO Take 1 drop by mouth daily.    .  cholecalciferol (VITAMIN D3) 25 MCG (1000 UNIT) tablet Take 1,000 Units by mouth daily.    . COLLAGEN PO Take 1 Scoop by mouth daily.    . Multiple Vitamins-Iron (MULTIVITAMINS WITH IRON) TABS tablet Take 1 tablet by mouth daily.    . norethindrone-ethinyl estradiol (LOESTRIN FE) 1-20 MG-MCG tablet Take 1 tablet by mouth daily.    . vitamin B-12 (CYANOCOBALAMIN) 1000 MCG tablet Take 1,000 mcg by mouth daily.    Marland Kitchen albuterol (PROVENTIL HFA;VENTOLIN HFA) 108 (90 Base) MCG/ACT inhaler Inhale 2 puffs into the lungs every 4 (four) hours as needed for wheezing or shortness of breath. 1 Inhaler 0   No current facility-administered medications for this visit.    REVIEW OF SYSTEMS:   Constitutional: Denies fevers, chills or abnormal night sweats Eyes: Denies blurriness of vision, double vision or watery eyes Ears, nose, mouth, throat, and face: Denies mucositis or sore throat Respiratory: Denies cough, dyspnea or wheezes Cardiovascular: Denies palpitation, chest discomfort or lower extremity swelling Gastrointestinal:  Denies nausea, heartburn or change in bowel habits Skin: Denies abnormal skin rashes Lymphatics: Denies new lymphadenopathy  Neurological:Denies numbness, tingling or new weaknesses Behavioral/Psych: Mood is stable, no new changes  All other systems were reviewed with the patient and are negative.  PHYSICAL EXAMINATION: ECOG PERFORMANCE STATUS: 0 - Asymptomatic  Vitals:   12/29/20 1333  BP: 123/82  Pulse: 94  Resp: 18  Temp: 97.7 F (36.5 C)  SpO2: 95%   Filed Weights   12/29/20 1333  Weight: 237 lb (107.5 kg)    GENERAL:alert, no distress and comfortable.    SKIN: skin color, texture, turgor are normal, no rashes or significant lesions EYES: normal, conjunctiva are pink and non-injected, sclera clear OROPHARYNX:no exudate, no erythema and lips, buccal mucosa, and tongue normal  NECK: supple, thyroid normal size, non-tender, without nodularity LYMPH:  no palpable  lymphadenopathy in the cervical, axillary or inguinal LUNGS: clear to auscultation and percussion with normal breathing effort HEART: regular rate & rhythm and no murmurs and no lower extremity edema ABDOMEN:abdomen soft, non-tender and normal bowel sounds.  Noted abdominal striae Musculoskeletal:no cyanosis of digits and no clubbing  PSYCH: alert & oriented x 3 with fluent speech NEURO: no focal motor/sensory deficits  ASSESSMENT & PLAN:  Easy bruising The cause of her easy bruising is unknown I asked the patient to refrain from taking any NSAID in the future in anticipation for work-up to rule out bleeding disorder or platelet disorder She is on oral contraceptive pill currently and that could affect testing for von Willebrand disease I recommend she come off her birth control pill for at least 2 weeks before returning to get her blood work done and she is in agreement I will set up virtual visit to review test results with her  Anemia She has history  of anemia but currently not anemic She is taking multivitamin with iron supplement  Obesity, Class III, BMI 40-49.9 (morbid obesity) (HCC) She desire weight loss surgery in the future with [facility] Once we have negative work-up for bleeding disorder, I will send a letter back to her referring physician about this  Orders Placed This Encounter  Procedures  . CBC with Differential/Platelet    Standing Status:   Future    Standing Expiration Date:   12/31/2021  . Comprehensive metabolic panel    Standing Status:   Future    Standing Expiration Date:   12/31/2021  . Platelet aggregation study, blood    Standing Status:   Future    Standing Expiration Date:   12/31/2021  . APTT    Standing Status:   Future    Standing Expiration Date:   12/31/2021  . Protime-INR    Standing Status:   Future    Standing Expiration Date:   12/31/2021  . Fibrinogen    Standing Status:   Future    Standing Expiration Date:   12/31/2021  . Von Willebrand panel     Standing Status:   Future    Standing Expiration Date:   12/31/2021     All questions were answered. The patient knows to call the clinic with any problems, questions or concerns.  The total time spent in the appointment was 55 minutes encounter with patients including review of chart and various tests results, discussions about plan of care and coordination of care plan  Artis Delay, MD 4/17/202210:37 AM       Artis Delay, MD 12/31/20 10:37 AM

## 2020-12-31 ENCOUNTER — Encounter: Payer: Self-pay | Admitting: Hematology and Oncology

## 2020-12-31 DIAGNOSIS — R233 Spontaneous ecchymoses: Secondary | ICD-10-CM | POA: Insufficient documentation

## 2020-12-31 DIAGNOSIS — R238 Other skin changes: Secondary | ICD-10-CM | POA: Insufficient documentation

## 2020-12-31 NOTE — Assessment & Plan Note (Signed)
The cause of her easy bruising is unknown I asked the patient to refrain from taking any NSAID in the future in anticipation for work-up to rule out bleeding disorder or platelet disorder She is on oral contraceptive pill currently and that could affect testing for von Willebrand disease I recommend she come off her birth control pill for at least 2 weeks before returning to get her blood work done and she is in agreement I will set up virtual visit to review test results with her

## 2020-12-31 NOTE — Assessment & Plan Note (Signed)
She has history of anemia but currently not anemic She is taking multivitamin with iron supplement

## 2020-12-31 NOTE — Assessment & Plan Note (Signed)
She desire weight loss surgery in the future with [facility] Once we have negative work-up for bleeding disorder, I will send a letter back to her referring physician about this

## 2021-01-22 ENCOUNTER — Inpatient Hospital Stay: Payer: BC Managed Care – PPO | Attending: Hematology and Oncology

## 2021-01-22 ENCOUNTER — Other Ambulatory Visit: Payer: Self-pay

## 2021-01-22 DIAGNOSIS — R238 Other skin changes: Secondary | ICD-10-CM

## 2021-01-22 DIAGNOSIS — R233 Spontaneous ecchymoses: Secondary | ICD-10-CM | POA: Diagnosis present

## 2021-01-22 LAB — COMPREHENSIVE METABOLIC PANEL
ALT: 28 U/L (ref 0–44)
AST: 22 U/L (ref 15–41)
Albumin: 3.7 g/dL (ref 3.5–5.0)
Alkaline Phosphatase: 115 U/L (ref 38–126)
Anion gap: 12 (ref 5–15)
BUN: 11 mg/dL (ref 6–20)
CO2: 23 mmol/L (ref 22–32)
Calcium: 9.5 mg/dL (ref 8.9–10.3)
Chloride: 106 mmol/L (ref 98–111)
Creatinine, Ser: 0.88 mg/dL (ref 0.44–1.00)
GFR, Estimated: >60 mL/min (ref >60)
Glucose, Bld: 112 mg/dL — ABNORMAL HIGH (ref 70–99)
Potassium: 3.6 mmol/L (ref 3.5–5.1)
Sodium: 141 mmol/L (ref 135–145)
Total Bilirubin: 0.6 mg/dL (ref 0.3–1.2)
Total Protein: 7.7 g/dL (ref 6.5–8.1)

## 2021-01-22 LAB — CBC WITH DIFFERENTIAL/PLATELET
Abs Immature Granulocytes: 0.02 10*3/uL (ref 0.00–0.07)
Basophils Absolute: 0 10*3/uL (ref 0.0–0.1)
Basophils Relative: 0 %
Eosinophils Absolute: 0.2 10*3/uL (ref 0.0–0.5)
Eosinophils Relative: 2 %
HCT: 42.6 % (ref 36.0–46.0)
Hemoglobin: 14 g/dL (ref 12.0–15.0)
Immature Granulocytes: 0 %
Lymphocytes Relative: 34 %
Lymphs Abs: 2.5 10*3/uL (ref 0.7–4.0)
MCH: 27.5 pg (ref 26.0–34.0)
MCHC: 32.9 g/dL (ref 30.0–36.0)
MCV: 83.5 fL (ref 80.0–100.0)
Monocytes Absolute: 0.5 10*3/uL (ref 0.1–1.0)
Monocytes Relative: 7 %
Neutro Abs: 4.1 10*3/uL (ref 1.7–7.7)
Neutrophils Relative %: 57 %
Platelets: 238 10*3/uL (ref 150–400)
RBC: 5.1 MIL/uL (ref 3.87–5.11)
RDW: 12.8 % (ref 11.5–15.5)
WBC: 7.3 10*3/uL (ref 4.0–10.5)
nRBC: 0 % (ref 0.0–0.2)

## 2021-01-22 LAB — FIBRINOGEN: Fibrinogen: 622 mg/dL — ABNORMAL HIGH (ref 210–475)

## 2021-01-22 LAB — PROTIME-INR
INR: 1 (ref 0.8–1.2)
Prothrombin Time: 13.1 seconds (ref 11.4–15.2)

## 2021-01-22 LAB — APTT: aPTT: 35 seconds (ref 24–36)

## 2021-01-23 LAB — VON WILLEBRAND PANEL
Coagulation Factor VIII: 188 % — ABNORMAL HIGH (ref 56–140)
Ristocetin Co-factor, Plasma: 139 % (ref 50–200)
Von Willebrand Antigen, Plasma: 176 % (ref 50–200)

## 2021-01-23 LAB — COAG STUDIES INTERP REPORT

## 2021-01-29 ENCOUNTER — Other Ambulatory Visit: Payer: Self-pay | Admitting: Hematology and Oncology

## 2021-01-29 DIAGNOSIS — R233 Spontaneous ecchymoses: Secondary | ICD-10-CM

## 2021-01-29 DIAGNOSIS — R238 Other skin changes: Secondary | ICD-10-CM

## 2021-01-31 ENCOUNTER — Inpatient Hospital Stay: Payer: BC Managed Care – PPO

## 2021-01-31 ENCOUNTER — Other Ambulatory Visit: Payer: Self-pay

## 2021-01-31 DIAGNOSIS — R238 Other skin changes: Secondary | ICD-10-CM

## 2021-01-31 DIAGNOSIS — R233 Spontaneous ecchymoses: Secondary | ICD-10-CM

## 2021-02-05 ENCOUNTER — Inpatient Hospital Stay (HOSPITAL_BASED_OUTPATIENT_CLINIC_OR_DEPARTMENT_OTHER): Payer: BC Managed Care – PPO | Admitting: Hematology and Oncology

## 2021-02-05 ENCOUNTER — Other Ambulatory Visit: Payer: Self-pay

## 2021-02-05 ENCOUNTER — Other Ambulatory Visit: Payer: Self-pay | Admitting: Hematology and Oncology

## 2021-02-05 DIAGNOSIS — R238 Other skin changes: Secondary | ICD-10-CM | POA: Diagnosis not present

## 2021-02-05 DIAGNOSIS — R233 Spontaneous ecchymoses: Secondary | ICD-10-CM

## 2021-02-06 ENCOUNTER — Telehealth: Payer: Self-pay

## 2021-02-06 ENCOUNTER — Encounter: Payer: Self-pay | Admitting: Hematology and Oncology

## 2021-02-06 NOTE — Assessment & Plan Note (Signed)
I have reviewed test results with the patient Screening test for von Willebrand disease were negative She has normal liver and platelet counts On review of her platelet aggregation study, results indicated slight reduced on low ADP but normal at higher ADP There is slight abnormality in the platelet aggregation in the presence of arachidonic acid; she denies recent use of NSAID or aspirin containing products Just to be sure, I would recommend platelet function assay to be performed before further recommendation about surgical clearance She is in agreement I will arrange this to be done on Wednesday I will plan to call her with test results

## 2021-02-06 NOTE — Telephone Encounter (Signed)
Called and scheduled lab appt at 0730 for tomorrow. She is aware of appt time.

## 2021-02-06 NOTE — Progress Notes (Signed)
HEMATOLOGY-ONCOLOGY ELECTRONIC VISIT PROGRESS NOTE  Patient Care Team: Mila Palmer, MD as PCP - General (Family Medicine)  I connected with the patient via telephone call to review test results ASSESSMENT & PLAN:  Easy bruising I have reviewed test results with the patient Screening test for von Willebrand disease were negative She has normal liver and platelet counts On review of her platelet aggregation study, results indicated slight reduced on low ADP but normal at higher ADP There is slight abnormality in the platelet aggregation in the presence of arachidonic acid; she denies recent use of NSAID or aspirin containing products Just to be sure, I would recommend platelet function assay to be performed before further recommendation about surgical clearance She is in agreement I will arrange this to be done on Wednesday I will plan to call her with test results    Orders Placed This Encounter  Procedures  . Platelet function assay    PFA-100    Standing Status:   Future    Standing Expiration Date:   02/06/2022    INTERVAL HISTORY: Please see below for problem oriented charting. We reviewed test results that was performed recently She denies taking any NSAID or aspirin containing products She denies recent spontaneous bleeding  SUMMARY OF HEMATOLOGIC HISTORY: Colleen Mcgee 50 y.o. female is here because of referral from [facility] bariatric surgery team for evaluation of this patient with history of anemia and easy bruising The patient have class III obesity and is currently being evaluated for possible bariatric surgery in the future I do not have a recent copy of her blood work Anemia is in her history but according to latest set of blood work that I could see dated 05/04/2020 from [place], she is not anemic and not iron deficient The patient stated she have history of iron deficiency secondary to ulcerative colitis but for the past few years, her anemia has resolved She denies  significant flare of ulcerative colitis She has not needed steroid treatment for a long time She also have history of sinus congestion and asthma but has not been on steroid containing products for a long time  She believes the main purpose of seeing me is to rule out bleeding disorder The patient stated that she bruises easily A few times over the past few months, she noted spontaneous bruising and did not recall hitting herself or injuring herself She denies spontaneous bleeding episodes She has not needed blood transfusion in the past She takes occasional NSAID but denies taking any aspirin containing products She had wisdom tooth removal, cataract surgery and colposcopy in the past without excessive bleeding She has natural childbirth and denies postpartum hemorrhage She is currently on birth control pill to control her menstrual cycles She desires bariatric surgery this summer and hence is referred here for surgical clearance from hematology standpoint In May 2022, platelet aggregation study is slightly abnormal but screening test for von Willebrand's disease and coagulation studies were normal  REVIEW OF SYSTEMS:   Constitutional: Denies fevers, chills or abnormal weight loss Eyes: Denies blurriness of vision Ears, nose, mouth, throat, and face: Denies mucositis or sore throat Respiratory: Denies cough, dyspnea or wheezes Cardiovascular: Denies palpitation, chest discomfort Gastrointestinal:  Denies nausea, heartburn or change in bowel habits Skin: Denies abnormal skin rashes Lymphatics: Denies new lymphadenopathy or easy bruising Neurological:Denies numbness, tingling or new weaknesses Behavioral/Psych: Mood is stable, no new changes  Extremities: No lower extremity edema All other systems were reviewed with the patient and are negative.  I have reviewed the past medical history, past surgical history, social history and family history with the patient and they are unchanged from  previous note.  ALLERGIES:  is allergic to aspirin.  MEDICATIONS:  Current Outpatient Medications  Medication Sig Dispense Refill  . albuterol (PROVENTIL HFA;VENTOLIN HFA) 108 (90 Base) MCG/ACT inhaler Inhale 2 puffs into the lungs every 4 (four) hours as needed for wheezing or shortness of breath. 1 Inhaler 0  . CHLOROPHYLL PO Take 1 drop by mouth daily.    . cholecalciferol (VITAMIN D3) 25 MCG (1000 UNIT) tablet Take 1,000 Units by mouth daily.    . COLLAGEN PO Take 1 Scoop by mouth daily.    . Multiple Vitamins-Iron (MULTIVITAMINS WITH IRON) TABS tablet Take 1 tablet by mouth daily.    . norethindrone-ethinyl estradiol (LOESTRIN FE) 1-20 MG-MCG tablet Take 1 tablet by mouth daily.    . vitamin B-12 (CYANOCOBALAMIN) 1000 MCG tablet Take 1,000 mcg by mouth daily.     No current facility-administered medications for this visit.    PHYSICAL EXAMINATION: ECOG PERFORMANCE STATUS: 0 - Asymptomatic  LABORATORY DATA:  I have reviewed the data as listed CMP Latest Ref Rng & Units 01/22/2021 04/12/2011  Glucose 70 - 99 mg/dL 502(D) 93  BUN 6 - 20 mg/dL 11 12  Creatinine 7.41 - 1.00 mg/dL 2.87 0.7  Sodium 867 - 145 mmol/L 141 140  Potassium 3.5 - 5.1 mmol/L 3.6 4.0  Chloride 98 - 111 mmol/L 106 104  CO2 22 - 32 mmol/L 23 30  Calcium 8.9 - 10.3 mg/dL 9.5 8.6  Total Protein 6.5 - 8.1 g/dL 7.7 7.4  Total Bilirubin 0.3 - 1.2 mg/dL 0.6 1.0  Alkaline Phos 38 - 126 U/L 115 95  AST 15 - 41 U/L 22 21  ALT 0 - 44 U/L 28 23    Lab Results  Component Value Date   WBC 7.3 01/22/2021   HGB 14.0 01/22/2021   HCT 42.6 01/22/2021   MCV 83.5 01/22/2021   PLT 238 01/22/2021   NEUTROABS 4.1 01/22/2021    I discussed the assessment and treatment plan with the patient. The patient was provided an opportunity to ask questions and all were answered. The patient agreed with the plan and demonstrated an understanding of the instructions. The patient was advised to call back or seek an in-person evaluation  if the symptoms worsen or if the condition fails to improve as anticipated.    I spent 20 minutes for the appointment reviewing test results, discuss management and coordination of care.  Artis Delay, MD 02/06/2021 10:32 AM

## 2021-02-07 ENCOUNTER — Other Ambulatory Visit: Payer: Self-pay

## 2021-02-07 ENCOUNTER — Inpatient Hospital Stay: Payer: BC Managed Care – PPO

## 2021-02-07 DIAGNOSIS — R233 Spontaneous ecchymoses: Secondary | ICD-10-CM | POA: Diagnosis not present

## 2021-02-07 DIAGNOSIS — R238 Other skin changes: Secondary | ICD-10-CM

## 2021-02-07 LAB — PLATELET FUNCTION ASSAY: Collagen / Epinephrine: 103 seconds (ref 0–193)

## 2021-02-07 LAB — MISCELLANEOUS TEST

## 2021-02-08 ENCOUNTER — Encounter: Payer: Self-pay | Admitting: Hematology and Oncology

## 2021-02-08 ENCOUNTER — Telehealth: Payer: Self-pay | Admitting: Hematology and Oncology

## 2021-02-08 NOTE — Telephone Encounter (Signed)
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  She will benefit from 2 units of platelet transfusion after surgery to reduce risk of bleeding. I plan to see her in the future within 1 to 2 days after discharge from hospital to monitor for signs of bleeding and will provide further platelet transfusion support if needed I have addressed all her questions and concerns

## 2021-02-08 NOTE — Progress Notes (Signed)
Faxed letter to Dr. Charlton Amor for Hematology clearance for surgery to [phone], recieved confirmation.

## 2021-03-20 ENCOUNTER — Telehealth: Payer: Self-pay | Admitting: Hematology and Oncology

## 2021-03-20 NOTE — Telephone Encounter (Signed)
Scheduled appt per 7/5 sch msg. Called pt, no answer. Left msg with appt date and time.  

## 2021-06-21 ENCOUNTER — Encounter: Payer: Self-pay | Admitting: Hematology and Oncology

## 2021-07-12 ENCOUNTER — Inpatient Hospital Stay: Payer: BC Managed Care – PPO | Admitting: Hematology and Oncology

## 2021-07-12 ENCOUNTER — Telehealth: Payer: Self-pay | Admitting: Hematology and Oncology

## 2021-07-12 NOTE — Telephone Encounter (Signed)
R/s appt per patients request. Confirmed new date and time

## 2021-07-16 ENCOUNTER — Inpatient Hospital Stay: Payer: BC Managed Care – PPO | Attending: Hematology and Oncology | Admitting: Hematology and Oncology

## 2021-07-16 ENCOUNTER — Encounter: Payer: Self-pay | Admitting: Hematology and Oncology

## 2021-07-16 ENCOUNTER — Other Ambulatory Visit: Payer: Self-pay

## 2021-07-16 DIAGNOSIS — K5190 Ulcerative colitis, unspecified, without complications: Secondary | ICD-10-CM | POA: Diagnosis not present

## 2021-07-16 DIAGNOSIS — R233 Spontaneous ecchymoses: Secondary | ICD-10-CM | POA: Diagnosis present

## 2021-07-16 DIAGNOSIS — Z9884 Bariatric surgery status: Secondary | ICD-10-CM | POA: Diagnosis not present

## 2021-07-16 DIAGNOSIS — Z6841 Body Mass Index (BMI) 40.0 and over, adult: Secondary | ICD-10-CM | POA: Insufficient documentation

## 2021-07-16 DIAGNOSIS — R110 Nausea: Secondary | ICD-10-CM | POA: Insufficient documentation

## 2021-07-16 DIAGNOSIS — Z79890 Hormone replacement therapy (postmenopausal): Secondary | ICD-10-CM | POA: Diagnosis not present

## 2021-07-16 DIAGNOSIS — R638 Other symptoms and signs concerning food and fluid intake: Secondary | ICD-10-CM | POA: Insufficient documentation

## 2021-07-16 NOTE — Progress Notes (Signed)
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, manifested by primary wave only in response to arachidonic acid on platelet aggregation study We discussed the risk and benefits of further testing including electron microscopy to confirm Overall, there is no definitive treatment for storage pool disorder Risk of bleeding is variable Interestingly, she has been on birth control pill/hormone replacement therapy for a long time and that is known to be protective against risk of bleeding With her recent surgery, she denies postoperative bleeding Her CBC at the time of discharge was within normal range She does not need long-term follow-up I will see her again in the future if perioperative management is needed  Obesity, Class III, BMI 40-49.9 (morbid obesity) (HCC) She had recent successful bariatric surgery She will continue close monitoring and follow-up with her surgeons at [facility] I am hopeful she will not run into mineral deficiencies in the future but should she need parenteral vitamin B12 or iron infusions in the future, I will see her back  No orders of the defined types were placed in this encounter.   The total time spent in the appointment was 20 minutes encounter with patients including review of chart and various tests results, discussions about plan of care and coordination of care plan   All questions were answered. The patient knows to call the clinic with any problems, questions or concerns. No barriers to learning was detected.    Artis Delay, MD 10/31/20221:16 PM  INTERVAL HISTORY: Perle Brickhouse 50 y.o. 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 female is here because of referral from [facility] bariatric surgery team for evaluation of this patient with history of anemia and easy bruising The patient have class III obesity and is currently being evaluated for possible bariatric surgery in the future I do not have a recent copy of her blood work Anemia is in her history but according to latest set of blood work that I could see dated 05/04/2020 from [place], she is not anemic and not iron deficient The patient stated she have history of iron deficiency secondary to ulcerative colitis but for the past few years, her anemia has resolved She denies significant flare of ulcerative colitis She has not needed steroid treatment for a long time She also have history of sinus congestion and asthma but has not been on steroid containing products for a long time  She believes the main purpose of seeing me is to rule out bleeding disorder The patient stated that she bruises easily A few times over the past few months, she noted spontaneous bruising and did not recall hitting herself or injuring herself She denies spontaneous bleeding episodes She has not needed blood transfusion in the past She takes occasional NSAID but denies taking any aspirin containing products She had wisdom tooth removal, cataract surgery and colposcopy in the past without excessive bleeding She has natural childbirth and denies postpartum hemorrhage She is currently on birth control pill to control her menstrual cycles She desires bariatric surgery this summer and hence is referred here for surgical clearance from hematology standpoint In May 2022, platelet aggregation study is slightly abnormal but screening  test for von Willebrand's disease and coagulation studies were normal Subsequent 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  ALLERGIES:  is allergic to aspirin.  MEDICATIONS:  Current Outpatient Medications  Medication Sig Dispense Refill   albuterol (PROVENTIL HFA;VENTOLIN HFA) 108 (90 Base) MCG/ACT inhaler Inhale 2 puffs into the lungs every 4 (four) hours as needed for wheezing or shortness of breath. 1 Inhaler 0   Multiple Vitamins-Iron (MULTIVITAMINS WITH IRON) TABS tablet Take 1 tablet by mouth daily.     No current facility-administered medications for this visit.     REVIEW OF SYSTEMS:   Constitutional: Denies fevers, chills or night sweats Eyes: Denies blurriness of vision Ears, nose, mouth, throat, and face: Denies mucositis or sore throat Respiratory: Denies cough, dyspnea or wheezes Cardiovascular: Denies palpitation, chest discomfort or lower extremity swelling Gastrointestinal:  Denies nausea, heartburn or change in bowel habits Skin: Denies abnormal skin rashes Lymphatics: Denies new lymphadenopathy or easy bruising Neurological:Denies numbness, tingling or new weaknesses Behavioral/Psych: Mood is stable, no new changes  All other systems were reviewed with the patient and are negative.  PHYSICAL EXAMINATION: ECOG PERFORMANCE STATUS: 0 - Asymptomatic  Vitals:   07/16/21 1212  BP: 119/68  Pulse: (!) 109  Resp: 18  Temp: (!) 95.6 F (35.3 C)  SpO2: 97%   Filed Weights   07/16/21 1212  Weight: 221 lb 3.2 oz (100.3 kg)    GENERAL:alert, no distress and comfortable ABDOMEN:abdomen soft, noted well-healed surgical scar Musculoskeletal:no cyanosis of digits and no clubbing  NEURO: alert & oriented x 3 with fluent speech, no focal motor/sensory deficits  LABORATORY DATA:  I have reviewed the data as listed     Component Value Date/Time   NA 141 01/22/2021 0745   K 3.6 01/22/2021 0745   CL 106 01/22/2021 0745   CO2 23 01/22/2021 0745   GLUCOSE 112 (H) 01/22/2021 0745   BUN 11 01/22/2021  0745   CREATININE 0.88 01/22/2021 0745   CALCIUM 9.5 01/22/2021 0745   PROT 7.7 01/22/2021 0745   ALBUMIN 3.7 01/22/2021 0745   AST 22 01/22/2021 0745   ALT 28 01/22/2021 0745   ALKPHOS 115 01/22/2021 0745   BILITOT 0.6 01/22/2021 0745   GFRNONAA >60 01/22/2021 0745    No results found for: SPEP, UPEP  Lab Results  Component Value Date   WBC 7.3 01/22/2021   NEUTROABS 4.1 01/22/2021   HGB 14.0 01/22/2021   HCT 42.6 01/22/2021   MCV 83.5 01/22/2021   PLT 238 01/22/2021      Chemistry      Component Value Date/Time   NA 141 01/22/2021 0745   K 3.6 01/22/2021 0745   CL 106 01/22/2021 0745   CO2 23 01/22/2021 0745   BUN 11 01/22/2021 0745   CREATININE 0.88 01/22/2021 0745      Component Value Date/Time   CALCIUM 9.5 01/22/2021 0745   ALKPHOS 115 01/22/2021 0745   AST 22 01/22/2021 0745   ALT 28 01/22/2021 0745   BILITOT 0.6 01/22/2021 0745

## 2021-07-16 NOTE — Assessment & Plan Note (Addendum)
She had recent successful bariatric surgery She will continue close monitoring and follow-up with her surgeons at [facility] I am hopeful she will not run into mineral deficiencies in the future but should she need parenteral vitamin B12 or iron infusions in the future, I will see her back

## 2021-07-16 NOTE — Assessment & Plan Note (Signed)
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, manifested by primary wave only in response to arachidonic acid on platelet aggregation study We discussed the risk and benefits of further testing including electron microscopy to confirm Overall, there is no definitive treatment for storage pool disorder Risk of bleeding is variable Interestingly, she has been on birth control pill/hormone replacement therapy for a long time and that is known to be protective against risk of bleeding With her recent surgery, she denies postoperative bleeding Her CBC at the time of discharge was within normal range She does not need long-term follow-up I will see her again in the future if perioperative management is needed

## 2021-08-08 IMAGING — RF DG UGI W SINGLE CM
10 of 16 series · 13 of 23 positions shown · non-contrast
Comparison: None.

CLINICAL DATA: Morbid obesity. A preop gastric sleeve. History of
ulcerative colitis and Crohn's

EXAM:
UPPER GI SERIES WITH KUB
TECHNIQUE: After obtaining a scout radiograph a routine upper GI series was
performed using thin barium
FLUOROSCOPY TIME:  Fluoroscopy Time:  2.3 minute
Radiation Exposure Index (if provided by the fluoroscopic device):
96.6 mGy
Number of Acquired Spot Images: 0

[Series 1: t abdomen supine · 0.15mm/px · 1 of 1 slices shown]
[im 1/1]
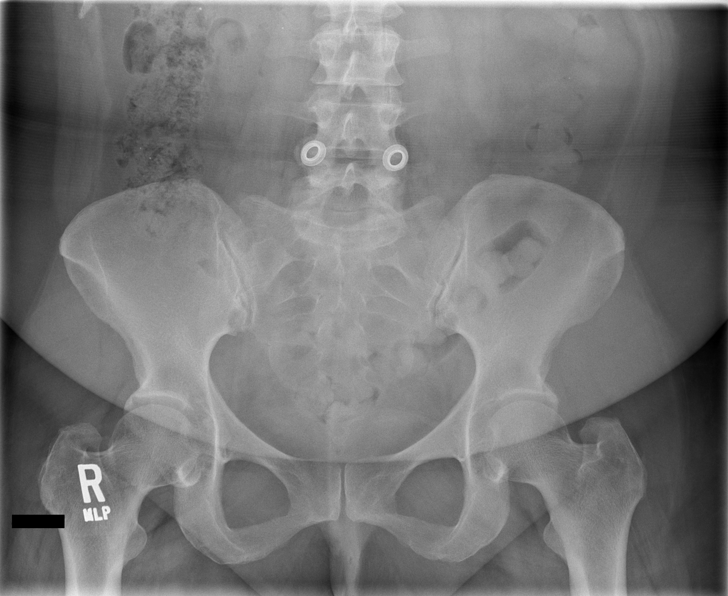

[Series 3: fluoro_barium 2fps_bw · 0.17mm/px · 1 of 2 frames shown (1 of 4)]
[frame 1/2]
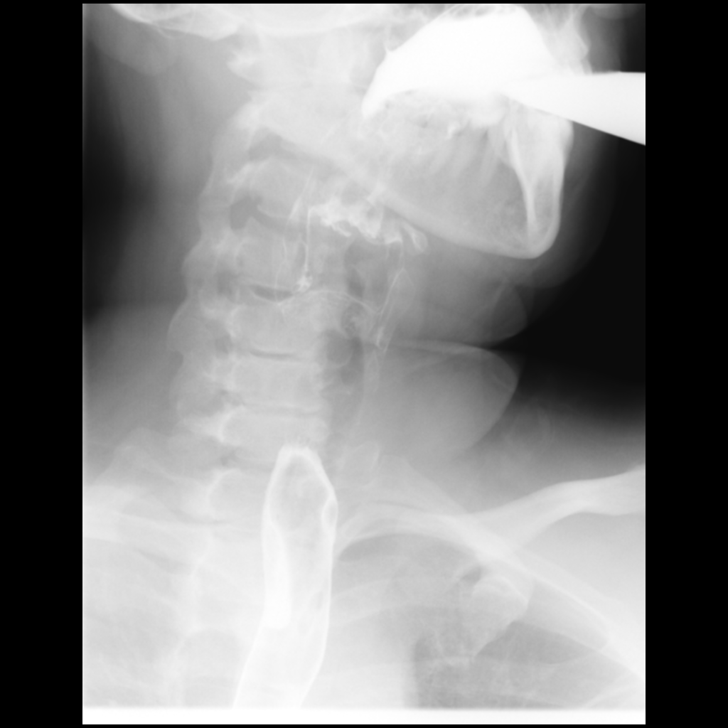

[Series 4: cp_standard · 0.35mm/px · 3 of 137 frames shown (1 of 5)]
[frame 9/137]
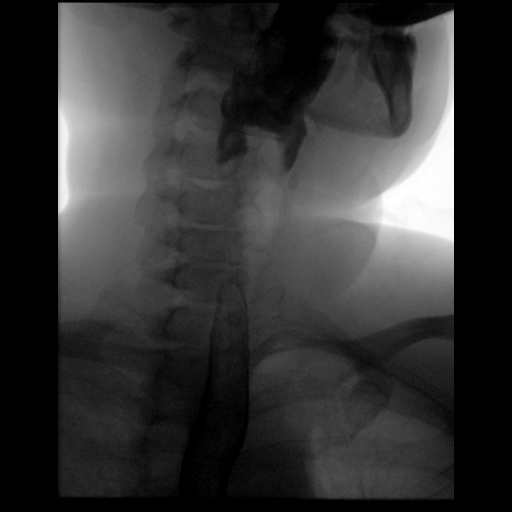
[frame 69/137]
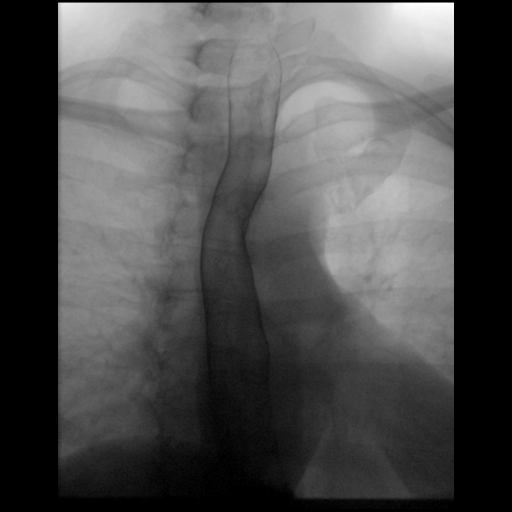
[frame 117/137]
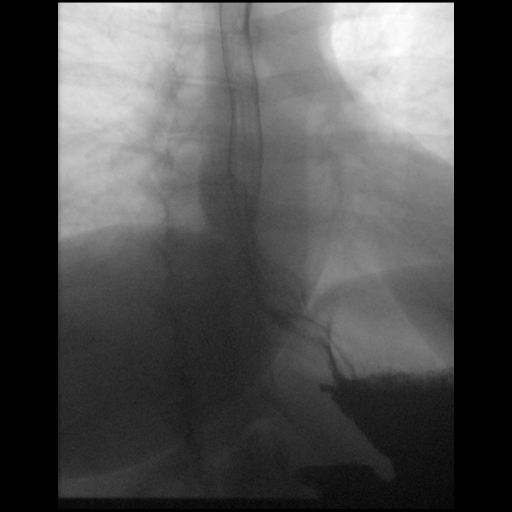

[Series 6: fluoro_barium 2fps_bw · 0.18mm/px · 1 of 1 slices shown (2 of 4)]
[im 1/1]
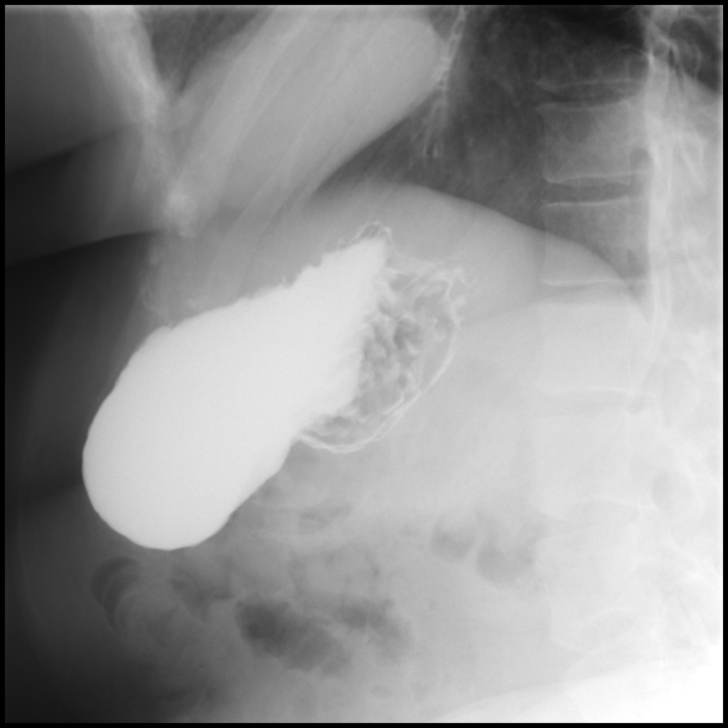

[Series 8: fluoro_barium 2fps_bw · 0.18mm/px · 1 of 1 slices shown (3 of 4)]
[im 1/1]
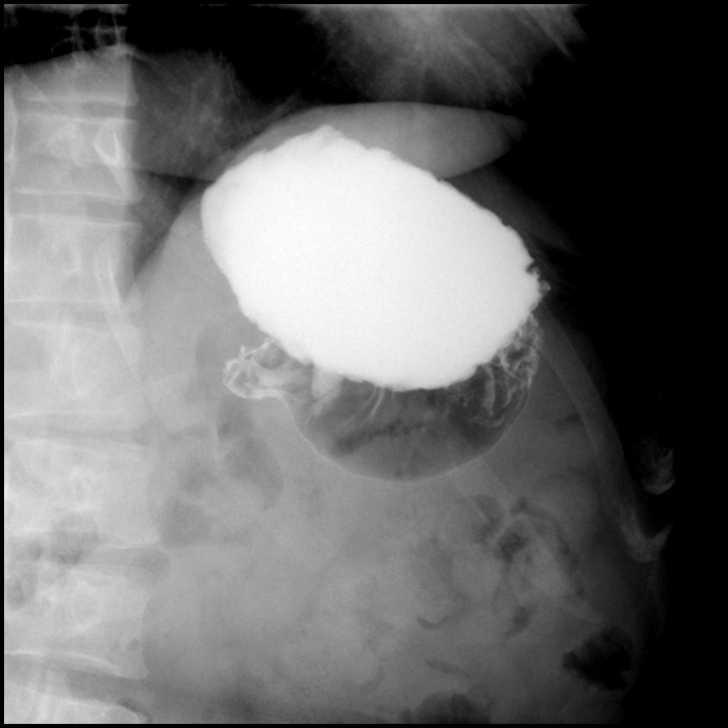

[Series 9: cp_standard · 0.35mm/px · 2 of 126 frames shown (2 of 5)]
[frame 64/126]
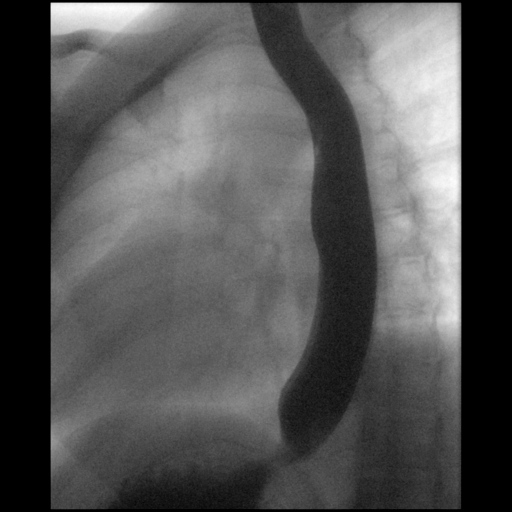
[frame 108/126]
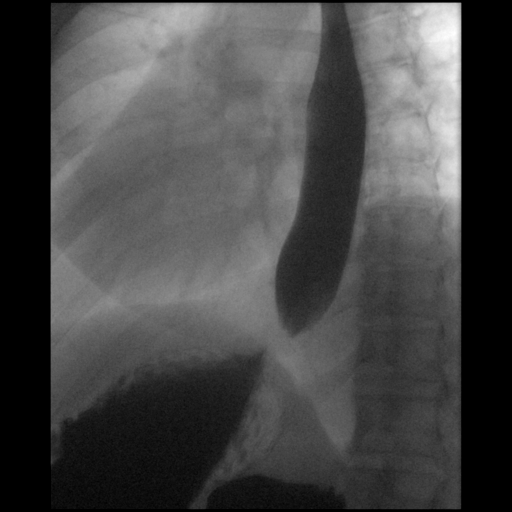

[Series 10: cp_standard · 0.17mm/px · 1 of 1 slices shown (3 of 5)]
[im 1/1]
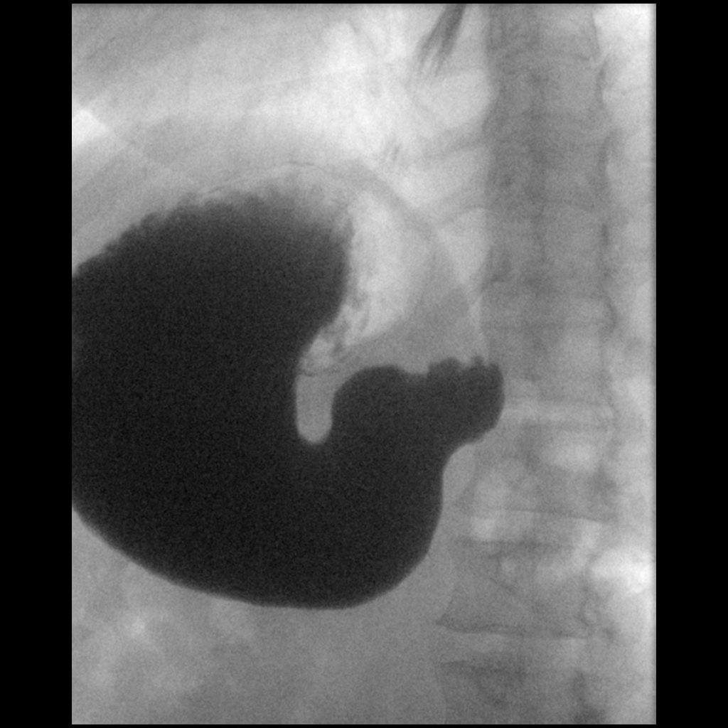

[Series 12: cp_standard · 0.18mm/px · 1 of 1 slices shown (4 of 5)]
[im 1/1]
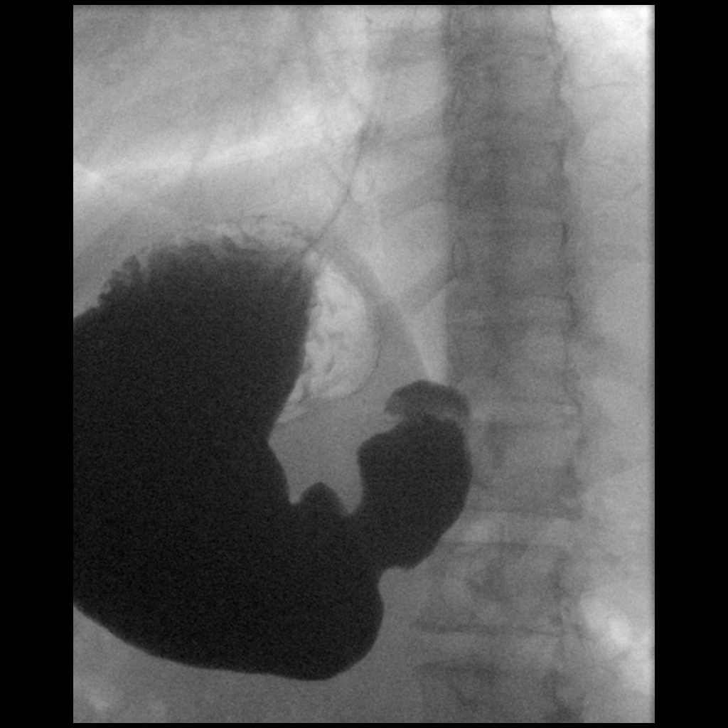

[Series 14: fluoro_barium 2fps_bw · 0.18mm/px · 1 of 1 slices shown (4 of 4)]
[im 1/1]
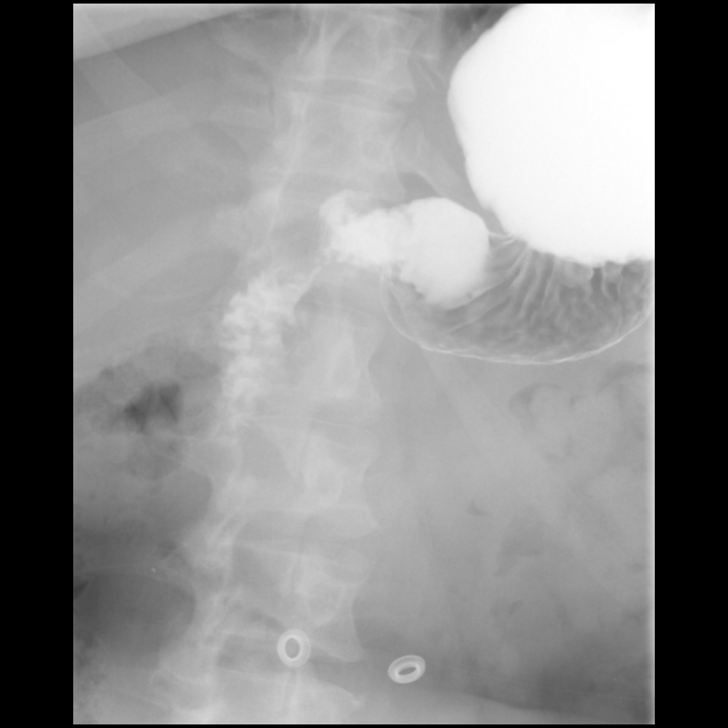

[Series 16: cp_standard · 0.18mm/px · 1 of 1 slices shown (5 of 5)]
[im 1/1]
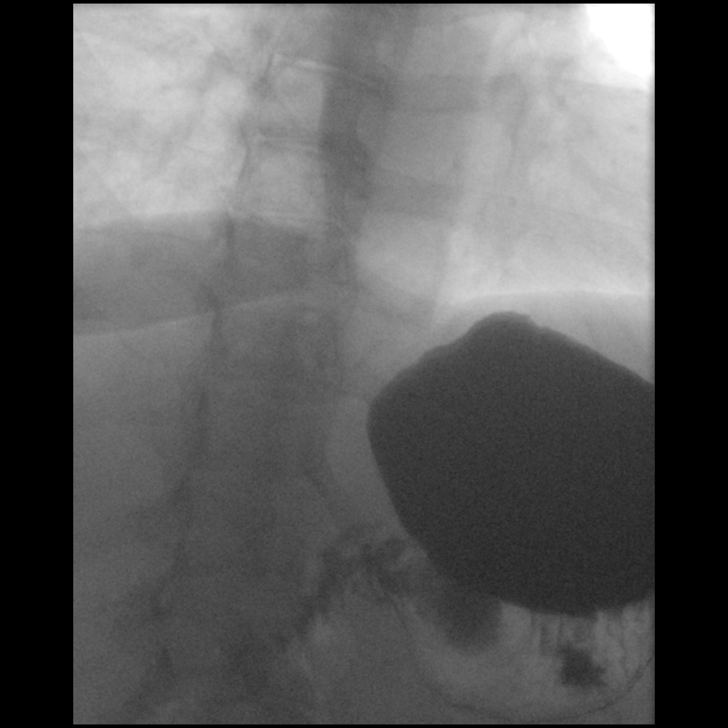

[13 of 23 positions shown; findings below may reference images not displayed]

FINDINGS: Preliminary KUB negative

Esophageal mucosa and motility normal. No esophageal stricture or
mass. No hiatal hernia or reflux identified.

Normal stomach. Normal gastric mucosa. No ulcer or mass. Stomach
with slow to empty. Duodenal bulb normal.
IMPRESSION: Normal upper GI.

## 2022-04-20 ENCOUNTER — Institutional Professional Consult (permissible substitution): Payer: BC Managed Care – PPO | Admitting: Plastic Surgery

## 2022-05-17 ENCOUNTER — Institutional Professional Consult (permissible substitution): Payer: BC Managed Care – PPO | Admitting: Plastic Surgery

## 2022-06-10 ENCOUNTER — Encounter: Payer: Self-pay | Admitting: Orthopaedic Surgery

## 2022-06-10 ENCOUNTER — Ambulatory Visit: Payer: BC Managed Care – PPO | Admitting: Orthopaedic Surgery

## 2022-06-10 DIAGNOSIS — M1812 Unilateral primary osteoarthritis of first carpometacarpal joint, left hand: Secondary | ICD-10-CM | POA: Diagnosis not present

## 2022-06-10 MED ORDER — LIDOCAINE HCL 1 % IJ SOLN
1.0000 mL | INTRAMUSCULAR | Status: AC | PRN
  Administered 2022-06-10: 1 mL

## 2022-06-10 MED ORDER — METHYLPREDNISOLONE ACETATE 40 MG/ML IJ SUSP
40.0000 mg | INTRAMUSCULAR | Status: AC | PRN
  Administered 2022-06-10: 40 mg

## 2022-06-10 NOTE — Progress Notes (Signed)
Office Visit Note   Patient: Colleen Mcgee           Date of Birth: September 20, 1970           MRN: [id number] Visit Date: 06/10/2022              Requested by: Jonathon Jordan, MD [address] [place],  [place] [address] PCP: Jonathon Jordan, MD   Assessment & Plan: Visit Diagnoses:  1. Arthritis of carpometacarpal (CMC) joint of left thumb     Plan: Her signs and symptoms the way she describes her left hand pain combined with the clinical exam findings point to this being more of the basilar thumb joint arthritis.  I did recommend a steroid injection around the base of the thumb and she agreed to this and tolerated it well.  I would like to see her back in 4 weeks to see how she is doing overall.  She can cancel that appointment if she is doing better.  If she is having pain again I would like to get 3 views of her left hand.  All questions and concerns were answered and addressed.  She was feeling better around this area with lidocaine aspect of the injection when she was leaving.  Follow-Up Instructions: Return in about 4 weeks (around 07/08/2022).   Orders:  Orders Placed This Encounter  Procedures   Hand/UE Inj   No orders of the defined types were placed in this encounter.     Procedures: Hand/UE Inj: L thumb CMC for osteoarthritis on 06/10/2022 9:11 AM Medications: 1 mL lidocaine 1 %; 40 mg methylPREDNISolone acetate 40 MG/ML      Clinical Data: No additional findings.   Subjective: Chief Complaint  Patient presents with   Left Hand - Pain  The patient is a right-hand-dominant 51 year old female who comes in with left hand nondominant pain and burning this been going on for several years now and slowly getting worse.  She points to the base of her thumb as the main source of her pain and the burning sensation.  It does wake her up at night.  It hurts with gripping activities mainly.  She denies numbness and tingling in her index or middle finger.  She denies  any specific injuries.  This has been slowly getting worse.  She is not a diabetic.  She is very active.  HPI  Review of Systems There is no listed fever, chills, nausea, vomiting  Objective: Vital Signs: There were no vitals taken for this visit.  Physical Exam She is alert and oriented x3 and in no acute distress Ortho Exam Examination of her left hand shows pain along the base of the [facility] joint with a positive grind test.  Her Wynn Maudlin test is negative.  She has negative Phalen's and Tinel's sign over the transverse carpal ligament.  There is no numbness in the tips of any of her digits.  There is no muscle atrophy. Specialty Comments:  No specialty comments available.  Imaging: No results found.   PMFS History: Patient Active Problem List   Diagnosis Date Noted   Easy bruising 12/31/2020   Obesity, Class III, BMI 40-49.9 (morbid obesity) ([place]) 12/31/2020   Obesity 12/10/2019   Preventative health care 04/12/2011   Allergy    Asthma    Hyperlipidemia    Anemia    Cervical stenosis of spine    Ulcerative colitis    Cervical disc disease    Lumbar disc disease  Distal paresthesia    Past Medical History:  Diagnosis Date   Allergy    Anemia    Asthma    Cervical disc disease    Cervical stenosis of spine    Distal paresthesia    Hyperlipidemia    Lumbar disc disease    Multiple thyroid nodules    Ulcerative colitis    UTI (lower urinary tract infection)     Family History  Problem Relation Age of Onset   Cancer Mother        colon cancer   Hyperlipidemia Mother    Hypertension Mother    Alcohol abuse Father    Hypertension Father    Seizures Father    Hyperlipidemia Maternal Grandmother    Stroke Maternal Grandmother    Hypertension Maternal Grandmother    Diabetes Maternal Grandmother    Hypertension Maternal Grandfather    Cancer Paternal Grandmother        breast cancer   Stroke Paternal Grandfather    Cancer Other        prostate cancer     Past Surgical History:  Procedure Laterality Date   cataract Left    COLPOSCOPY     abnormal paps- 1992; 2010 and 2012   WISDOM TOOTH EXTRACTION     Social History   Occupational History   Occupation: ED. S Pharmacist, hospital  Tobacco Use   Smoking status: Never   Smokeless tobacco: Never  Substance and Sexual Activity   Alcohol use: No    Comment: social   Drug use: No   Sexual activity: Not on file

## 2022-11-18 ENCOUNTER — Encounter: Payer: Self-pay | Admitting: Orthopaedic Surgery

## 2022-11-18 ENCOUNTER — Ambulatory Visit: Payer: BC Managed Care – PPO | Admitting: Orthopaedic Surgery

## 2022-11-18 ENCOUNTER — Ambulatory Visit (INDEPENDENT_AMBULATORY_CARE_PROVIDER_SITE_OTHER): Payer: BC Managed Care – PPO

## 2022-11-18 DIAGNOSIS — M79642 Pain in left hand: Secondary | ICD-10-CM | POA: Diagnosis not present

## 2022-11-18 MED ORDER — LIDOCAINE HCL 1 % IJ SOLN
1.0000 mL | INTRAMUSCULAR | Status: AC | PRN
  Administered 2022-11-18: 1 mL

## 2022-11-18 MED ORDER — METHYLPREDNISOLONE ACETATE 40 MG/ML IJ SUSP
40.0000 mg | INTRAMUSCULAR | Status: AC | PRN
  Administered 2022-11-18: 40 mg

## 2022-11-18 NOTE — Progress Notes (Signed)
The patient is a right-hand-dominant female that I seen before.  She comes in with pain at the base of her left thumb around the [facility] joint.  I have seen her before for this and provided injection around that area which helped her quite a bit.  She is never had an x-ray.  She says she does get a little bit of numbness and burning but really gripping and pinching activities cause most of the pain.  She denies any triggering.  She denies any numbness and tingling today.  She denies any pain over the radial styloid.  On exam she does have positive grind test at the base of the thumb joint on the right side at the [facility] joint.  The remainder of her thumb and wrist exam are entirely normal.  3 views of the left hand show arthritic changes at the basilar thumb/CMC joint with some osteophytes around the joint.  Since the steroid injection has helped her significantly the past she would like to have another steroid injection today.  I agree with this as well and she tolerated the injection.  Follow-up is as needed.  If things worsen she knows to let us know.      Procedure Note  Patient: Colleen Mcgee             Date of Birth: [birth year]           MRN: [id number]             Visit Date: 11/18/2022  Procedures: Visit Diagnoses:  1. Pain in left hand     Hand/UE Inj: L thumb CMC for osteoarthritis on 11/18/2022 3:40 PM Medications: 1 mL lidocaine 1 %; 40 mg methylPREDNISolone acetate 40 MG/ML

## 2023-03-28 ENCOUNTER — Other Ambulatory Visit: Payer: Self-pay | Admitting: Oncology

## 2023-03-28 DIAGNOSIS — Z006 Encounter for examination for normal comparison and control in clinical research program: Secondary | ICD-10-CM

## 2023-04-06 ENCOUNTER — Encounter (INDEPENDENT_AMBULATORY_CARE_PROVIDER_SITE_OTHER): Payer: Self-pay

## 2023-04-15 ENCOUNTER — Other Ambulatory Visit (HOSPITAL_COMMUNITY)
Admission: RE | Admit: 2023-04-15 | Discharge: 2023-04-15 | Disposition: A | Discharge status: Home / Self Care | Payer: Self-pay | Source: Other Acute Inpatient Hospital | Attending: Oncology | Admitting: Oncology

## 2023-04-15 DIAGNOSIS — Z006 Encounter for examination for normal comparison and control in clinical research program: Secondary | ICD-10-CM | POA: Insufficient documentation

## 2023-04-30 ENCOUNTER — Encounter: Payer: Self-pay | Admitting: Orthopaedic Surgery

## 2023-04-30 ENCOUNTER — Ambulatory Visit: Payer: BC Managed Care – PPO | Admitting: Orthopaedic Surgery

## 2023-04-30 DIAGNOSIS — M79642 Pain in left hand: Secondary | ICD-10-CM | POA: Diagnosis not present

## 2023-04-30 DIAGNOSIS — M1812 Unilateral primary osteoarthritis of first carpometacarpal joint, left hand: Secondary | ICD-10-CM | POA: Diagnosis not present

## 2023-04-30 MED ORDER — LIDOCAINE HCL 1 % IJ SOLN
1.0000 mL | INTRAMUSCULAR | Status: AC | PRN
  Administered 2023-04-30: 1 mL

## 2023-04-30 MED ORDER — METHYLPREDNISOLONE ACETATE 40 MG/ML IJ SUSP
40.0000 mg | INTRAMUSCULAR | Status: AC | PRN
  Administered 2023-04-30: 40 mg

## 2023-04-30 NOTE — Progress Notes (Signed)
The patient is well-known to me.  She has significant left basilar thumb joint (CMC joint) arthritis.  We have injected this area before with a steroid and she would like to have an injection today.  It has been about 5 months since her last injection.  We did talk about the possibility of seeing our hand specialist here at some point.  On exam she does have grinding at the base of her thumb on that left side.  It is slowly getting worse for her.  There is pain throughout the exam.  She is neurovascularly intact.  I did place a steroid injection around her left thumb CMC joint.  Follow-up is as needed.  I have encouraged her to reach out to our hand specialist if this worsens.    Procedure Note  Patient: Colleen Mcgee             Date of Birth: Jul 31, 1971           MRN: [id number]             Visit Date: 04/30/2023  Procedures: Visit Diagnoses:  1. Pain in left hand   2. Arthritis of carpometacarpal [facility]) joint of left thumb     Hand/UE Inj: L thumb CMC for osteoarthritis on 04/30/2023 9:50 AM Medications: 1 mL lidocaine 1 %; 40 mg methylPREDNISolone acetate 40 MG/ML

## 2023-06-23 ENCOUNTER — Encounter: Payer: Self-pay | Admitting: Orthopaedic Surgery

## 2023-07-04 ENCOUNTER — Ambulatory Visit: Payer: Self-pay | Admitting: Podiatry

## 2023-07-14 ENCOUNTER — Ambulatory Visit: Payer: BC Managed Care – PPO | Admitting: Orthopedic Surgery

## 2023-07-14 ENCOUNTER — Other Ambulatory Visit (INDEPENDENT_AMBULATORY_CARE_PROVIDER_SITE_OTHER): Payer: Self-pay

## 2023-07-14 ENCOUNTER — Other Ambulatory Visit (INDEPENDENT_AMBULATORY_CARE_PROVIDER_SITE_OTHER): Payer: BC Managed Care – PPO

## 2023-07-14 DIAGNOSIS — M1812 Unilateral primary osteoarthritis of first carpometacarpal joint, left hand: Secondary | ICD-10-CM

## 2023-07-14 DIAGNOSIS — M19031 Primary osteoarthritis, right wrist: Secondary | ICD-10-CM

## 2023-07-14 NOTE — Progress Notes (Signed)
Colleen Mcgee - 52 y.o. female MRN [id number]  Date of birth: 13-Mar-1971  Office Visit Note: Visit Date: 07/14/2023 PCP: Mila Palmer, MD Referred by: Mila Palmer, MD  Subjective: No chief complaint on file.  HPI: Colleen Mcgee is a pleasant 52 y.o. female who presents today for evaluation of bilateral thumb basilar joint pain that has been present now for multiple months, worsening in nature.  Pain greater on the left than the right.  She has undergone prior injections with Dr. Magnus Ivan, at this point is not getting lasting relief on the left side from the injections.  Most recent in August of 2024.    Pertinent ROS were reviewed with the patient and found to be negative unless otherwise specified above in HPI.   Visit Reason:bilateral hands [facility]) Duration of symptoms: 6+ months Hand dominance: right Occupation: Teacher/ PT registration @ [employer] Diabetic: No Smoking: No Heart/Lung History: Asthma Blood Thinners: none  Prior Testing/EMG: xrays 11/18/22 Injections (Date): 04/2023 Treatments: injection Prior Surgery:  Assessment & Plan: Visit Diagnoses:  1. Arthritis of carpometacarpal (CMC) joint of left thumb   2. [facility] DJD(carpometacarpal degenerative joint disease), localized primary, right     Plan: Extensive discussion was had with the patient today regarding her bilateral thumb CMC osteoarthritis, left greater than right.  Given that her symptoms have become refractory to conservative care at this time, she is indicated for left thumb [facility] arthroplasty with internal brace suspensionplasty.  Risks and benefits of the procedure were discussed, risks including but not limited to infection, bleeding, scarring, stiffness, nerve injury, tendon injury, vascular injury, hardware complication if utilized, recurrence of symptoms and need for subsequent operation.  Patient expressed understanding.  We will move forward with surgical scheduling at the next available  date.     Follow-up: No follow-ups on file.   Meds & Orders: No orders of the defined types were placed in this encounter.   Orders Placed This Encounter  Procedures   XR Wrist Complete Left   XR Wrist Complete Right     Procedures: No procedures performed      Clinical History: No specialty comments available.  She reports that she has never smoked. She has never used smokeless tobacco. No results for input(s): "HGBA1C", "LABURIC" in the last 8760 hours.  Objective:   Vital Signs: There were no vitals taken for this visit.  Physical Exam  Gen: Well-appearing, in no acute distress; non-toxic CV: Regular Rate. Well-perfused. Warm.  Resp: Breathing unlabored on room air; no wheezing. Psych: Fluid speech in conversation; appropriate affect; normal thought process  Ortho Exam General: Patient is well appearing and in no distress. Cervical spine mobility is full in all directions:   Skin and Muscle: No skin changes are apparent to upper extremities.  Muscle bulk and contour normal, no signs of atrophy.      Range of Motion and Palpation Tests: Mobility is full about the elbows with flexion and extension.  Forearm supination and pronation are 85/85 bilaterally.  Wrist flexion/extension is 75/65 bilaterally.  Digital flexion and extension are full.  Thumb opposition is full to the base of the small fingers bilaterally.     No cords or nodules are palpated.  No triggering is observed.     Significant tenderness over the bilateral thumb CMC articulation is observed, positive grind, positive crepitus.  MP hyperextension negative bilaterally.    Finklestein test positive bilaterally   Neurologic, Vascular, Motor: Sensation is intact to light touch in the median/radial/ulnar distributions.  Tinel's testing negative at wrist level. Phalen's negative, Derkan's compression negative.  Fingers pink and well perfused.  Capillary refill is brisk.     Imaging: XR Wrist Complete  Left  Result Date: 07/14/2023 X-rays of the left wrist including dedicated [place] views were obtained today X-rays demonstrate significant degenerative change at the thumb [facility] articulation with joint space narrowing, osteophyte formation and subchondral sclerosis.  XR Wrist Complete Right  Result Date: 07/14/2023 X-rays of the right wrist including dedicated [place] views were obtained today X-rays demonstrate significant degenerative change at the thumb [facility] articulation with joint space narrowing, osteophyte formation and subchondral sclerosis.   Past Medical/Family/Surgical/Social History: Medications & Allergies reviewed per EMR, new medications updated. Patient Active Problem List   Diagnosis Date Noted   Easy bruising 12/31/2020   Obesity, Class III, BMI 40-49.9 (morbid obesity) (HCC) 12/31/2020   Obesity 12/10/2019   Preventative health care 04/12/2011   Allergy    Asthma    Hyperlipidemia    Anemia    Cervical stenosis of spine    Ulcerative colitis (HCC)    Cervical disc disease    Lumbar disc disease    Distal paresthesia    Past Medical History:  Diagnosis Date   Allergy    Anemia    Asthma    Cervical disc disease    Cervical stenosis of spine    Distal paresthesia    Hyperlipidemia    Lumbar disc disease    Multiple thyroid nodules    Ulcerative colitis    UTI (lower urinary tract infection)    Family History  Problem Relation Age of Onset   Cancer Mother        colon cancer   Hyperlipidemia Mother    Hypertension Mother    Alcohol abuse Father    Hypertension Father    Seizures Father    Hyperlipidemia Maternal Grandmother    Stroke Maternal Grandmother    Hypertension Maternal Grandmother    Diabetes Maternal Grandmother    Hypertension Maternal Grandfather    Cancer Paternal Grandmother        breast cancer   Stroke Paternal Grandfather    Cancer Other        prostate cancer   Past Surgical History:  Procedure Laterality Date   cataract Left     COLPOSCOPY     abnormal paps- 1992; 2010 and 2012   WISDOM TOOTH EXTRACTION     Social History   Occupational History   Occupation: ED. S Runner, broadcasting/film/video  Tobacco Use   Smoking status: Never   Smokeless tobacco: Never  Substance and Sexual Activity   Alcohol use: No    Comment: social   Drug use: No   Sexual activity: Not on file    Carinna Newhart Fara Boros) Denese Killings, M.D. [facility] 9:03 PM

## 2023-07-25 ENCOUNTER — Telehealth: Payer: Self-pay | Admitting: Orthopedic Surgery

## 2023-07-25 NOTE — Telephone Encounter (Signed)
[employer] forms received. To [employer]. 

## 2023-07-28 ENCOUNTER — Ambulatory Visit: Payer: BC Managed Care – PPO | Admitting: Orthopedic Surgery

## 2023-08-17 HISTORY — PX: FINGER ARTHROSCOPY WITH CARPOMETACARPEL (CMC) ARTHROPLASTY: SHX5629

## 2023-09-03 ENCOUNTER — Encounter: Payer: Self-pay | Admitting: Orthopedic Surgery

## 2023-09-04 ENCOUNTER — Other Ambulatory Visit: Payer: Self-pay | Admitting: Orthopedic Surgery

## 2023-09-04 DIAGNOSIS — M1812 Unilateral primary osteoarthritis of first carpometacarpal joint, left hand: Secondary | ICD-10-CM | POA: Diagnosis not present

## 2023-09-04 MED ORDER — OXYCODONE HCL 5 MG PO TABS: 5.0000 mg | ORAL_TABLET | Freq: Four times a day (QID) | ORAL | 0 refills | Status: AC | PRN

## 2023-09-15 ENCOUNTER — Ambulatory Visit (INDEPENDENT_AMBULATORY_CARE_PROVIDER_SITE_OTHER): Payer: BC Managed Care – PPO | Admitting: Orthopedic Surgery

## 2023-09-15 ENCOUNTER — Other Ambulatory Visit (INDEPENDENT_AMBULATORY_CARE_PROVIDER_SITE_OTHER): Payer: BC Managed Care – PPO

## 2023-09-15 DIAGNOSIS — M19031 Primary osteoarthritis, right wrist: Secondary | ICD-10-CM
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
## 2023-09-15 NOTE — Progress Notes (Signed)
   Colleen Mcgee - 52 y.o. female MRN [id number]  Date of birth: 1970/09/23  Office Visit Note: Visit Date: 09/15/2023 PCP: Mila Palmer, MD Referred by: Mila Palmer, MD  Subjective:  HPI: Colleen Mcgee is a 52 y.o. female who presents today for follow up 2 weeks status post left thumb CMC arthroplasty.  She is doing well overall, pain is well-controlled with over-the-counter medication at this point.  Pertinent ROS were reviewed with the patient and found to be negative unless otherwise specified above in HPI.   Assessment & Plan: Visit Diagnoses: No diagnosis found.  Plan: Suture tails were trimmed today.  Thumb spica splint was applied, she will be seen by occupational therapy tomorrow for fabrication of a removable orthosis.  Will wait until week 4 to begin range of motion exercises, progressive strengthening at week 8.  I will follow-up with her in 4 weeks time to track her progress from a range of motion standpoint.  Follow-up: No follow-ups on file.   Meds & Orders: No orders of the defined types were placed in this encounter.  No orders of the defined types were placed in this encounter.    Procedures: No procedures performed       Objective:   Vital Signs: There were no vitals taken for this visit.  Ortho Exam Left wrist: - Well-healed incision at the glabrous/nonglabrous juncture over the [facility] region of the thumb -Gentle thumb circumduction without significant pain or crepitus - Hand is warm well-perfused, sensation intact in all distributions including DRSN - Thumb pinch strength not tested   Imaging: No results found.   Kerwin Augustus Trevor Mace, M.D. [facility] 8:59 AM

## 2023-09-16 ENCOUNTER — Encounter: Payer: Self-pay | Admitting: Rehabilitative and Restorative Service Providers"

## 2023-09-16 ENCOUNTER — Ambulatory Visit: Payer: BC Managed Care – PPO | Admitting: Rehabilitative and Restorative Service Providers"

## 2023-09-16 DIAGNOSIS — M25542 Pain in joints of left hand: Secondary | ICD-10-CM

## 2023-09-16 DIAGNOSIS — R600 Localized edema: Secondary | ICD-10-CM

## 2023-09-16 DIAGNOSIS — M25541 Pain in joints of right hand: Secondary | ICD-10-CM

## 2023-09-16 DIAGNOSIS — M6281 Muscle weakness (generalized): Secondary | ICD-10-CM

## 2023-09-16 DIAGNOSIS — M25641 Stiffness of right hand, not elsewhere classified: Secondary | ICD-10-CM

## 2023-09-16 DIAGNOSIS — R278 Other lack of coordination: Secondary | ICD-10-CM

## 2023-09-16 DIAGNOSIS — M25631 Stiffness of right wrist, not elsewhere classified: Secondary | ICD-10-CM

## 2023-09-16 DIAGNOSIS — M25642 Stiffness of left hand, not elsewhere classified: Secondary | ICD-10-CM

## 2023-09-19 ENCOUNTER — Encounter: Payer: BC Managed Care – PPO | Admitting: Orthopedic Surgery
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
## 2023-09-24 ENCOUNTER — Ambulatory Visit: Payer: 59 | Admitting: Rehabilitative and Restorative Service Providers"

## 2023-09-24 ENCOUNTER — Encounter: Payer: Self-pay | Admitting: Rehabilitative and Restorative Service Providers"

## 2023-09-24 DIAGNOSIS — M6281 Muscle weakness (generalized): Secondary | ICD-10-CM

## 2023-09-24 DIAGNOSIS — R278 Other lack of coordination: Secondary | ICD-10-CM | POA: Diagnosis not present

## 2023-09-24 DIAGNOSIS — M25542 Pain in joints of left hand: Secondary | ICD-10-CM | POA: Diagnosis not present

## 2023-09-24 DIAGNOSIS — R600 Localized edema: Secondary | ICD-10-CM

## 2023-09-24 DIAGNOSIS — M25642 Stiffness of left hand, not elsewhere classified: Secondary | ICD-10-CM
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
## 2023-10-02 ENCOUNTER — Encounter: Payer: Self-pay | Admitting: Rehabilitative and Restorative Service Providers"

## 2023-10-02 ENCOUNTER — Ambulatory Visit: Payer: 59 | Admitting: Rehabilitative and Restorative Service Providers"

## 2023-10-02 ENCOUNTER — Encounter: Payer: 59 | Admitting: Rehabilitative and Restorative Service Providers"

## 2023-10-02 DIAGNOSIS — M25542 Pain in joints of left hand: Secondary | ICD-10-CM

## 2023-10-02 DIAGNOSIS — R600 Localized edema: Secondary | ICD-10-CM | POA: Diagnosis not present

## 2023-10-02 DIAGNOSIS — M6281 Muscle weakness (generalized): Secondary | ICD-10-CM | POA: Diagnosis not present

## 2023-10-02 DIAGNOSIS — R278 Other lack of coordination: Secondary | ICD-10-CM | POA: Diagnosis not present

## 2023-10-02 DIAGNOSIS — M25642 Stiffness of left hand, not elsewhere classified: Secondary | ICD-10-CM
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
## 2023-10-07 ENCOUNTER — Encounter: Payer: Self-pay | Admitting: Rehabilitative and Restorative Service Providers"

## 2023-10-07 ENCOUNTER — Ambulatory Visit: Payer: 59 | Admitting: Rehabilitative and Restorative Service Providers"

## 2023-10-07 DIAGNOSIS — M25642 Stiffness of left hand, not elsewhere classified: Secondary | ICD-10-CM

## 2023-10-07 DIAGNOSIS — M25542 Pain in joints of left hand: Secondary | ICD-10-CM

## 2023-10-07 DIAGNOSIS — M6281 Muscle weakness (generalized): Secondary | ICD-10-CM | POA: Diagnosis not present

## 2023-10-07 DIAGNOSIS — R278 Other lack of coordination: Secondary | ICD-10-CM | POA: Diagnosis not present

## 2023-10-07 DIAGNOSIS — R600 Localized edema: Secondary | ICD-10-CM

## 2023-10-13 ENCOUNTER — Ambulatory Visit (INDEPENDENT_AMBULATORY_CARE_PROVIDER_SITE_OTHER): Payer: BC Managed Care – PPO | Admitting: Orthopedic Surgery

## 2023-10-13 DIAGNOSIS — M1812 Unilateral primary osteoarthritis of first carpometacarpal joint, left hand: Secondary | ICD-10-CM

## 2023-10-13 NOTE — Progress Notes (Signed)
   Colleen Mcgee - 53 y.o. female MRN [id number]  Date of birth: 02/10/1971  Office Visit Note: Visit Date: 10/13/2023 PCP: Mila Palmer, MD Referred by: Mila Palmer, MD  Subjective:  HPI: Colleen Mcgee is a 53 y.o. female who presents today for follow up 6 weeks status post left thumb CMC arthroplasty.  She is doing well overall, progressing nicely with occupational therapy.  Pertinent ROS were reviewed with the patient and found to be negative unless otherwise specified above in HPI.   Assessment & Plan: Visit Diagnoses: No diagnosis found.  Plan: She continues to do very well postoperatively.  Is meeting all of her goals from an occupational therapy standpoint.  Pain is well-controlled.  Continue with range of motion protocol for additional 2 weeks, then can begin strengthening per protocol.  I will plan on seeing her back in approximate 6 weeks time.  Follow-up: No follow-ups on file.   Meds & Orders: No orders of the defined types were placed in this encounter.  No orders of the defined types were placed in this encounter.    Procedures: No procedures performed       Objective:   Vital Signs: There were no vitals taken for this visit.  Ortho Exam Left wrist: - Well-healed incision at the glabrous/nonglabrous juncture over the [facility] region of the thumb -Gentle thumb circumduction without significant pain or crepitus - Hand is warm well-perfused, sensation intact in all distributions including DRSN - Thumb opposition to the ring finger DIP   Imaging: No results found.   Rossie Scarfone Trevor Mace, M.D. [facility] 9:05 PM
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
## 2023-10-14 ENCOUNTER — Encounter: Payer: Self-pay | Admitting: Rehabilitative and Restorative Service Providers"

## 2023-10-14 ENCOUNTER — Ambulatory Visit (INDEPENDENT_AMBULATORY_CARE_PROVIDER_SITE_OTHER): Payer: 59 | Admitting: Rehabilitative and Restorative Service Providers"

## 2023-10-14 DIAGNOSIS — M25542 Pain in joints of left hand: Secondary | ICD-10-CM

## 2023-10-14 DIAGNOSIS — M25642 Stiffness of left hand, not elsewhere classified: Secondary | ICD-10-CM

## 2023-10-14 DIAGNOSIS — R278 Other lack of coordination: Secondary | ICD-10-CM

## 2023-10-14 DIAGNOSIS — M6281 Muscle weakness (generalized): Secondary | ICD-10-CM

## 2023-10-14 DIAGNOSIS — R600 Localized edema: Secondary | ICD-10-CM | POA: Diagnosis not present

## 2023-10-21 ENCOUNTER — Encounter: Payer: Self-pay | Admitting: Rehabilitative and Restorative Service Providers"

## 2023-10-28 ENCOUNTER — Encounter: Payer: 59 | Admitting: Rehabilitative and Restorative Service Providers"

## 2023-11-14 ENCOUNTER — Encounter (HOSPITAL_BASED_OUTPATIENT_CLINIC_OR_DEPARTMENT_OTHER): Payer: Self-pay | Admitting: Emergency Medicine

## 2023-11-14 ENCOUNTER — Emergency Department (HOSPITAL_BASED_OUTPATIENT_CLINIC_OR_DEPARTMENT_OTHER)
Admission: EM | Admit: 2023-11-14 | Discharge: 2023-11-14 | Discharge status: Against Medical Advice | Payer: 59 | Attending: Emergency Medicine | Admitting: Emergency Medicine

## 2023-11-14 DIAGNOSIS — R1110 Vomiting, unspecified: Secondary | ICD-10-CM | POA: Insufficient documentation

## 2023-11-14 DIAGNOSIS — Z5321 Procedure and treatment not carried out due to patient leaving prior to being seen by health care provider: Secondary | ICD-10-CM | POA: Diagnosis not present

## 2023-11-14 DIAGNOSIS — R059 Cough, unspecified: Secondary | ICD-10-CM | POA: Insufficient documentation

## 2023-11-14 DIAGNOSIS — R1013 Epigastric pain: Secondary | ICD-10-CM | POA: Diagnosis present

## 2023-11-14 LAB — URINALYSIS, ROUTINE W REFLEX MICROSCOPIC
Bilirubin Urine: NEGATIVE
Glucose, UA: NEGATIVE mg/dL
Hgb urine dipstick: NEGATIVE
Leukocytes,Ua: NEGATIVE
Nitrite: NEGATIVE
Specific Gravity, Urine: 1.03 (ref 1.005–1.030)
pH: 6 (ref 5.0–8.0)

## 2023-11-14 LAB — CBC
HCT: 41.6 % (ref 36.0–46.0)
Hemoglobin: 13.8 g/dL (ref 12.0–15.0)
MCH: 28.9 pg (ref 26.0–34.0)
MCHC: 33.2 g/dL (ref 30.0–36.0)
MCV: 87 fL (ref 80.0–100.0)
Platelets: 257 10*3/uL (ref 150–400)
RBC: 4.78 MIL/uL (ref 3.87–5.11)
RDW: 13.2 % (ref 11.5–15.5)
WBC: 5.5 10*3/uL (ref 4.0–10.5)
nRBC: 0 % (ref 0.0–0.2)

## 2023-11-14 LAB — COMPREHENSIVE METABOLIC PANEL
ALT: 16 U/L (ref 0–44)
AST: 18 U/L (ref 15–41)
Albumin: 4.1 g/dL (ref 3.5–5.0)
Alkaline Phosphatase: 68 U/L (ref 38–126)
Anion gap: 8 (ref 5–15)
BUN: 13 mg/dL (ref 6–20)
CO2: 27 mmol/L (ref 22–32)
Calcium: 8.7 mg/dL — ABNORMAL LOW (ref 8.9–10.3)
Chloride: 104 mmol/L (ref 98–111)
Creatinine, Ser: 0.84 mg/dL (ref 0.44–1.00)
GFR, Estimated: >60 mL/min (ref >60)
Glucose, Bld: 93 mg/dL (ref 70–99)
Potassium: 3.5 mmol/L (ref 3.5–5.1)
Sodium: 139 mmol/L (ref 135–145)
Total Bilirubin: 0.7 mg/dL (ref 0.0–1.2)
Total Protein: 7.1 g/dL (ref 6.5–8.1)

## 2023-11-14 LAB — LIPASE, BLOOD: Lipase: 20 U/L (ref 11–51)

## 2023-11-14 NOTE — ED Triage Notes (Signed)
 Flu + 1 month ago Continues cough Last night upper abdo pain (epigastric), burning, vomiting Able to sip water  Hx gastric surgery

## 2023-11-14 NOTE — ED Notes (Signed)
No answer when called to treatment room.  

## 2023-11-15 ENCOUNTER — Encounter (HOSPITAL_BASED_OUTPATIENT_CLINIC_OR_DEPARTMENT_OTHER): Payer: Self-pay | Admitting: Emergency Medicine

## 2023-11-15 ENCOUNTER — Emergency Department (HOSPITAL_BASED_OUTPATIENT_CLINIC_OR_DEPARTMENT_OTHER)
Admission: EM | Admit: 2023-11-15 | Discharge: 2023-11-15 | Disposition: A | Discharge status: Home / Self Care | Attending: Emergency Medicine | Admitting: Emergency Medicine

## 2023-11-15 ENCOUNTER — Other Ambulatory Visit: Payer: Self-pay

## 2023-11-15 DIAGNOSIS — R112 Nausea with vomiting, unspecified: Secondary | ICD-10-CM | POA: Diagnosis present

## 2023-11-15 DIAGNOSIS — R109 Unspecified abdominal pain: Secondary | ICD-10-CM | POA: Insufficient documentation

## 2023-11-15 DIAGNOSIS — J45909 Unspecified asthma, uncomplicated: Secondary | ICD-10-CM | POA: Insufficient documentation

## 2023-11-15 NOTE — ED Triage Notes (Signed)
 Upper abdominal pain with vomiting since yesterday.  Pt has been recovering from the flu.  Pt went to [facility] but [facility]BS.  Pt saw traces of blood and keytones in her urine and was concerned.  She wants to be evaluated due to vomiting.  Pt did eat some yogurt and has kept it down.

## 2023-11-15 NOTE — ED Provider Notes (Signed)
 [place] [facility] Provider Note   CSN: [id number] Arrival date & time: 11/15/23  4696     History  Chief Complaint  Patient presents with   Abdominal Pain    Colleen Mcgee is a 53 y.o. female with past medical history of allergies, asthma, hyperlipidemia, ulcerative colitis presenting to emergency room with complaint of 1 day of abdominal pain associated with nausea and vomiting.  Patient reports 5 episodes of nonbilious nonbloody vomit.  This occurred yesterday.  Since yesterday her symptoms have resolved.  She was diagnosed with the flu approximately 1 month ago reports her symptoms are improved.  She feels like she is getting sick again.  Today she was able to keep food and water down.  She reports she has been drinking a lot of water.  She reports she primarily returned because she was concerned that yesterday she had trace ketones and protein in her urine.   Otherwise no complaints or no concerns. Denies any chest pain, shortness of breath, fever, diarrhea, blood in stool, abdominal pain.   Abdominal Pain      Home Medications Prior to Admission medications   Medication Sig Start Date End Date Taking? Authorizing Provider  oxyCODONE (ROXICODONE) 5 MG immediate release tablet Take 1 tablet (5 mg total) by mouth every 6 (six) hours as needed for severe pain (pain score 7-10). 09/04/23   Agarwala, Lajuan Lines, MD  Multiple Vitamins-Iron (MULTIVITAMINS WITH IRON) TABS tablet Take 1 tablet by mouth daily.    [provider]      Allergies    Aspirin    Review of Systems   Review of Systems  Gastrointestinal:  Positive for abdominal pain.    Physical Exam Updated Vital Signs BP 105/81 (BP Location: Left Arm)   Pulse 82   Temp 98.1 F (36.7 C) (Oral)   Resp 16   Ht 5\' 3"  (1.6 m)   Wt 73.9 kg   LMP 08/19/2016 (Exact Date)   SpO2 98%   BMI 28.87 kg/m  Physical Exam Vitals and nursing note reviewed.  Constitutional:       General: She is not in acute distress.    Appearance: She is not toxic-appearing.  HENT:     Head: Normocephalic and atraumatic.  Eyes:     General: No scleral icterus.    Conjunctiva/sclera: Conjunctivae normal.  Cardiovascular:     Rate and Rhythm: Normal rate and regular rhythm.     Pulses: Normal pulses.     Heart sounds: Normal heart sounds.  Pulmonary:     Effort: Pulmonary effort is normal. No respiratory distress.     Breath sounds: Normal breath sounds.  Abdominal:     General: Abdomen is flat. Bowel sounds are normal.     Palpations: Abdomen is soft.     Tenderness: There is no abdominal tenderness.  Musculoskeletal:     Right lower leg: No edema.     Left lower leg: No edema.  Skin:    General: Skin is warm and dry.     Findings: No lesion.  Neurological:     General: No focal deficit present.     Mental Status: She is alert and oriented to person, place, and time. Mental status is at baseline.     ED Results / Procedures / Treatments   Labs (all labs ordered are listed, but only abnormal results are displayed) Labs Reviewed - No data to display  EKG None  Radiology No results found.  Procedures Procedures    Medications Ordered in ED Medications - No data to display  ED Course/ Medical Decision Making/ A&P                                 Medical Decision Making  Colin Norment 53 y.o. presented today for abd pain. Working DDx includes, but not limited to, gastroenteritis, colitis, SBO, appendicitis, cholecystitis, hepatobiliary pathology, gastritis, PUD, ACS, dissection, pancreatitis, nephrolithiasis, AAA, UTI, pyelonephritis, gastritis   R/o DDx: These are considered less likely than current impression due to history of present illness, physical exam, labs/imaging findings.   Pmhx: UC  Unique Tests and My Interpretation:  I reviewed labs from yesterday.  She had no gross abnormality on labs yesterday.  She had normal kidney function, normal  liver function no significant signs of dehydration or electrolyte abnormality.  Since she is now tolerating oral intake and feeling better I do not feel labs need to be repeated at this time.  She denies any urinary symptoms.  Imaging:  Patient reports her abdominal pain resolved on its own.  She did have epigastric pain yesterday.  On exam today she has no focal area of tenderness, abdomen is soft and nondistended.  She has no CVA tenderness.  Problem List / ED Course / Critical interventions / Medication management  Patient reporting with symptoms of gastritis including nausea, upper abdominal pain and vomiting.  She denies any recent travel.  She denies any recent antibiotic use.  She does work as a [employer] loss adjuster and has had recent exposure to GI bug as well as being in office.  She knows 2 people with GI bug last week.  Her symptoms have now resolved.  She is come to follow-up on her lab results.  We discussed her lab results in detail.  On exam she is alert oriented well-appearing.  She is hemodynamically stable.  Given her symptoms have improved do not feel lab work or imaging needs to be completed today.  She has no focal abdominal tenderness.  Stable for discharge. Offered Zofran for nausea however patient declines stating her symptoms are better. Patients vitals assessed. Upon arrival patient is hemodynamically stable.  I have reviewed the patients home medicines and have made adjustments as needed    Plan:  F/u w/ PCP in 2-3d to ensure resolution of sx.  Patient was given return precautions. Patient stable for discharge at this time.  Patient educated on current sx/dx and verbalized understanding of plan. Return to ER w/ new or worsening sx.          Final Clinical Impression(s) / ED Diagnoses Final diagnoses:  Nausea and vomiting, unspecified vomiting type    Rx / DC Orders ED Discharge Orders     None         Smitty Knudsen, PA-C 11/15/23 1232    Pricilla Loveless, MD 11/16/23 [phone]

## 2023-11-15 NOTE — Discharge Instructions (Signed)
 You were seen in the emergency room today for abdominal pain.  Your symptoms are consistent with gastritis.  Since you are feeling better I do not feel like we need to repeat imaging or labs today.  If you start developing diarrhea you could try over-the-counter Pepto-Bismol or Imodium.  Otherwise make sure staying well-hydrated with primarily water and you can alternate Gatorade or Pedialyte for electrolytes.  Return to emergency room if abdominal pain returns or if you are no longer tolerating eating or drinking.

## 2023-11-17 ENCOUNTER — Ambulatory Visit: Payer: 59 | Admitting: Rehabilitative and Restorative Service Providers"

## 2023-11-17 ENCOUNTER — Encounter: Payer: Self-pay | Admitting: Rehabilitative and Restorative Service Providers"

## 2023-11-17 DIAGNOSIS — R600 Localized edema: Secondary | ICD-10-CM

## 2023-11-17 DIAGNOSIS — R278 Other lack of coordination: Secondary | ICD-10-CM

## 2023-11-17 DIAGNOSIS — M25542 Pain in joints of left hand: Secondary | ICD-10-CM

## 2023-11-17 DIAGNOSIS — M6281 Muscle weakness (generalized): Secondary | ICD-10-CM | POA: Diagnosis not present

## 2023-11-17 DIAGNOSIS — M25642 Stiffness of left hand, not elsewhere classified: Secondary | ICD-10-CM
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
## 2023-11-21 ENCOUNTER — Other Ambulatory Visit: Payer: Self-pay | Admitting: Obstetrics and Gynecology

## 2023-11-21 DIAGNOSIS — Z1231 Encounter for screening mammogram for malignant neoplasm of breast: Secondary | ICD-10-CM
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
## 2023-11-24 ENCOUNTER — Encounter: Payer: Self-pay | Admitting: Rehabilitative and Restorative Service Providers"

## 2023-11-24 ENCOUNTER — Ambulatory Visit (INDEPENDENT_AMBULATORY_CARE_PROVIDER_SITE_OTHER): Payer: 59 | Admitting: Orthopedic Surgery

## 2023-11-24 ENCOUNTER — Ambulatory Visit (INDEPENDENT_AMBULATORY_CARE_PROVIDER_SITE_OTHER): Payer: 59 | Admitting: Rehabilitative and Restorative Service Providers"

## 2023-11-24 DIAGNOSIS — R600 Localized edema: Secondary | ICD-10-CM

## 2023-11-24 DIAGNOSIS — M6281 Muscle weakness (generalized): Secondary | ICD-10-CM

## 2023-11-24 DIAGNOSIS — R278 Other lack of coordination: Secondary | ICD-10-CM

## 2023-11-24 DIAGNOSIS — M25642 Stiffness of left hand, not elsewhere classified: Secondary | ICD-10-CM

## 2023-11-24 DIAGNOSIS — M1812 Unilateral primary osteoarthritis of first carpometacarpal joint, left hand: Secondary | ICD-10-CM

## 2023-11-24 DIAGNOSIS — M25542 Pain in joints of left hand: Secondary | ICD-10-CM | POA: Diagnosis not present

## 2023-11-24 NOTE — Progress Notes (Unsigned)
   Colleen Mcgee - 53 y.o. female MRN [id number]  Date of birth: Jan 30, 1971  Office Visit Note: Visit Date: 11/24/2023 PCP: Mila Palmer, MD Referred by: Mila Palmer, MD  Subjective:  HPI: Colleen Mcgee is a 53 y.o. female who presents today for follow up 11 weeks status post left thumb carpometacarpal arthroplasty, left tendon transfer abductor pollicis longus to flexor carp radialis tendon, left partial trapezoidectomy, left De Quervain's release.  Pertinent ROS were reviewed with the patient and found to be negative unless otherwise specified above in HPI.   Assessment & Plan: Visit Diagnoses: No diagnosis found.  Plan: ***  Follow-up: No follow-ups on file.   Meds & Orders: No orders of the defined types were placed in this encounter.  No orders of the defined types were placed in this encounter.    Procedures: No procedures performed       Objective:   Vital Signs: LMP 08/19/2016 (Exact Date)   Ortho Exam ***  Imaging: No results found.   Katharine Rochefort Trevor Mace, M.D. [facility], Hand Surgery
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
## 2023-12-01 ENCOUNTER — Encounter: Payer: Self-pay | Admitting: Rehabilitative and Restorative Service Providers"

## 2023-12-01 ENCOUNTER — Ambulatory Visit: Payer: 59 | Admitting: Rehabilitative and Restorative Service Providers"

## 2023-12-01 DIAGNOSIS — R278 Other lack of coordination: Secondary | ICD-10-CM | POA: Diagnosis not present

## 2023-12-01 DIAGNOSIS — M6281 Muscle weakness (generalized): Secondary | ICD-10-CM

## 2023-12-01 DIAGNOSIS — R600 Localized edema: Secondary | ICD-10-CM | POA: Diagnosis not present

## 2023-12-01 DIAGNOSIS — M25642 Stiffness of left hand, not elsewhere classified: Secondary | ICD-10-CM | POA: Diagnosis not present

## 2023-12-01 DIAGNOSIS — M25542 Pain in joints of left hand: Secondary | ICD-10-CM

## 2024-01-01 ENCOUNTER — Ambulatory Visit
Admission: RE | Admit: 2024-01-01 | Discharge: 2024-01-01 | Disposition: A | Discharge status: Home / Self Care | Source: Ambulatory Visit | Attending: Obstetrics and Gynecology | Admitting: Obstetrics and Gynecology

## 2024-01-01 DIAGNOSIS — Z1231 Encounter for screening mammogram for malignant neoplasm of breast: Secondary | ICD-10-CM

## 2024-07-15 ENCOUNTER — Ambulatory Visit: Admitting: Orthopedic Surgery

## 2024-07-15 DIAGNOSIS — M1812 Unilateral primary osteoarthritis of first carpometacarpal joint, left hand: Secondary | ICD-10-CM | POA: Diagnosis not present

## 2024-07-15 DIAGNOSIS — M19031 Primary osteoarthritis, right wrist: Secondary | ICD-10-CM | POA: Diagnosis not present

## 2024-07-15 MED ORDER — LIDOCAINE HCL 1 % IJ SOLN
1.0000 mL | INTRAMUSCULAR | Status: AC | PRN
  Administered 2024-07-15: 1 mL

## 2024-07-15 MED ORDER — BETAMETHASONE SOD PHOS & ACET 6 (3-3) MG/ML IJ SUSP
6.0000 mg | INTRAMUSCULAR | Status: AC | PRN
  Administered 2024-07-15: 6 mg via INTRA_ARTICULAR

## 2024-07-15 NOTE — Progress Notes (Signed)
   Colleen Mcgee - 53 y.o. female MRN [id number]  Date of birth: 13-Aug-1971  Office Visit Note: Visit Date: 07/15/2024 PCP: Verena Mems, MD Referred by: Verena Mems, MD  Subjective:  HPI: Colleen Mcgee is a 53 y.o. female who presents today for follow up 11 weeks status post left thumb carpometacarpal arthroplasty, left tendon transfer abductor pollicis longus to flexor carp radialis tendon, left partial trapezoidectomy, left De Quervain's release.  She is doing very well from the left side and is very pleased with her progress.  She does have some ongoing pain at the right thumb basilar joint region with no history of CMC arthritis.  Has not undergone significant treatments for the right side yet.  Pertinent ROS were reviewed with the patient and found to be negative unless otherwise specified above in HPI.   Assessment & Plan: Visit Diagnoses:  1. [facility] DJD(carpometacarpal degenerative joint disease), localized primary, right   2. Arthritis of carpometacarpal (CMC) joint of left thumb      Plan: She is doing well overall and is progressing nicely on the left side.  At this juncture, she does have some ongoing right thumb CMC arthritis as well.  Will begin with conservative measures.  She does understand the etiology and pathophysiology of this condition as she had it on the contralateral side and has undergone the surgery successfully.  Will begin with right wrist Comfort Cool bracing and an injection to the right thumb [facility] articulation for symptom management.  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  I explained that we can inject approximately every 3 months or so.  Risk and benefit of the injection were discussed in detail today, she expressed understanding, injection was tolerated well.  She can follow-up in approximate 3 months or on an as-needed basis.  Follow-up: No follow-ups on file.   Meds & Orders: No orders of the  defined types were placed in this encounter.  No orders of the defined types were placed in this encounter.    Procedures: Hand/UE Inj: R thumb CMC for osteoarthritis on 07/15/2024 8:57 AM Indications: pain Details: 25 G needle Medications: 1 mL lidocaine  1 %; 6 mg betamethasone acetate-betamethasone sodium phosphate 6 (3-3) MG/ML Outcome: tolerated well, no immediate complications Procedure, treatment alternatives, risks and benefits explained, specific risks discussed.           Objective:   Vital Signs: LMP 08/19/2016 (Exact Date)   Ortho Exam Left wrist: - Well-healed incision at the glabrous/nonglabrous juncture over the [facility] region of the thumb - Thumb opposition to the ring finger DPC - Thumb circumduction without significant pain or crepitus - Hand is warm well-perfused, sensation intact in all distributions including DRSN - Thumb pinch strength right 15, left 6  Right wrist: - Tenderness at the thumb CMC interval, positive CMC grind for pain and crepitus - Thumb opposition to the small finger DPC, no significant MP hyperextension - Sensation intact to light touch in median/radial/ulnar distributions, AIN/PIN/interosseous intact, hand mains warm well-perfused   Imaging: No results found.   Rachel Samples Afton Alderton, M.D. [facility], Hand Surgery

## 2024-07-19 ENCOUNTER — Encounter: Payer: Self-pay | Admitting: Radiology

## 2024-07-27 ENCOUNTER — Ambulatory Visit: Admitting: Orthopedic Surgery

## 2024-10-18 ENCOUNTER — Ambulatory Visit: Admitting: Orthopedic Surgery

## 2024-11-29 ENCOUNTER — Ambulatory Visit: Admitting: Orthopedic Surgery
